# Patient Record
Sex: Male | Born: 1955 | Race: White | Hispanic: No | Marital: Single | Smoking: Former smoker
Health system: Southern US, Community
[De-identification: ages and names within clinical notes are randomized; demographics above are authoritative.]

## PROBLEM LIST (undated history)

## (undated) DIAGNOSIS — N400 Enlarged prostate without lower urinary tract symptoms: Secondary | ICD-10-CM

## (undated) DIAGNOSIS — Z973 Presence of spectacles and contact lenses: Secondary | ICD-10-CM

## (undated) DIAGNOSIS — R972 Elevated prostate specific antigen [PSA]: Secondary | ICD-10-CM

## (undated) HISTORY — PX: NO PAST SURGERIES: SHX2092

---

## 1998-08-09 ENCOUNTER — Emergency Department (HOSPITAL_COMMUNITY): Admission: EM | Admit: 1998-08-09 | Discharge: 1998-08-09 | Payer: Self-pay | Admitting: Emergency Medicine

## 2015-02-23 ENCOUNTER — Other Ambulatory Visit (HOSPITAL_COMMUNITY): Payer: Self-pay | Admitting: Internal Medicine

## 2015-02-23 DIAGNOSIS — R9431 Abnormal electrocardiogram [ECG] [EKG]: Secondary | ICD-10-CM

## 2015-03-16 ENCOUNTER — Telehealth (HOSPITAL_COMMUNITY): Payer: Self-pay

## 2015-03-16 NOTE — Telephone Encounter (Signed)
Left message on voicemail in reference to upcoming appointment scheduled for 03/16/2008. Phone number given for a call back so details instructions can be given. S.Malakai Schoenherr EMTP

## 2015-03-17 ENCOUNTER — Ambulatory Visit (HOSPITAL_COMMUNITY): Payer: BLUE CROSS/BLUE SHIELD | Attending: Cardiology

## 2015-03-17 DIAGNOSIS — R9431 Abnormal electrocardiogram [ECG] [EKG]: Secondary | ICD-10-CM

## 2015-03-17 DIAGNOSIS — R079 Chest pain, unspecified: Secondary | ICD-10-CM | POA: Insufficient documentation

## 2015-03-17 DIAGNOSIS — I447 Left bundle-branch block, unspecified: Secondary | ICD-10-CM | POA: Diagnosis not present

## 2015-03-17 LAB — MYOCARDIAL PERFUSION IMAGING
CHL CUP NUCLEAR SRS: 6
CHL CUP NUCLEAR SSS: 8
CHL CUP RESTING HR STRESS: 64 {beats}/min
CSEPPHR: 84 {beats}/min
LV sys vol: 40 mL
LVDIAVOL: 124 mL (ref 62–150)
NUC STRESS TID: 0.96
RATE: 0.31
SDS: 2

## 2015-03-17 MED ORDER — TECHNETIUM TC 99M SESTAMIBI GENERIC - CARDIOLITE
10.6000 | Freq: Once | INTRAVENOUS | Status: AC | PRN
  Administered 2015-03-17: 11 via INTRAVENOUS

## 2015-03-17 MED ORDER — REGADENOSON 0.4 MG/5ML IV SOLN
0.4000 mg | Freq: Once | INTRAVENOUS | Status: AC
  Administered 2015-03-17: 0.4 mg via INTRAVENOUS

## 2015-03-17 MED ORDER — TECHNETIUM TC 99M SESTAMIBI GENERIC - CARDIOLITE
30.9000 | Freq: Once | INTRAVENOUS | Status: AC | PRN
  Administered 2015-03-17: 30.9 via INTRAVENOUS

## 2015-04-19 ENCOUNTER — Other Ambulatory Visit (HOSPITAL_COMMUNITY): Payer: Self-pay | Admitting: Urology

## 2015-04-19 ENCOUNTER — Other Ambulatory Visit: Payer: Self-pay | Admitting: Urology

## 2015-04-19 DIAGNOSIS — R972 Elevated prostate specific antigen [PSA]: Secondary | ICD-10-CM

## 2015-05-05 ENCOUNTER — Ambulatory Visit (HOSPITAL_COMMUNITY): Payer: BLUE CROSS/BLUE SHIELD

## 2015-05-15 ENCOUNTER — Ambulatory Visit (HOSPITAL_COMMUNITY)
Admission: RE | Admit: 2015-05-15 | Discharge: 2015-05-15 | Disposition: A | Discharge status: Home / Self Care | Payer: BLUE CROSS/BLUE SHIELD | Source: Ambulatory Visit | Attending: Urology | Admitting: Urology

## 2015-05-15 DIAGNOSIS — R972 Elevated prostate specific antigen [PSA]: Secondary | ICD-10-CM | POA: Insufficient documentation

## 2015-05-15 MED ORDER — GADOBENATE DIMEGLUMINE 529 MG/ML IV SOLN
20.0000 mL | Freq: Once | INTRAVENOUS | Status: AC | PRN
  Administered 2015-05-15: 20 mL via INTRAVENOUS

## 2015-07-20 DIAGNOSIS — E291 Testicular hypofunction: Secondary | ICD-10-CM | POA: Diagnosis not present

## 2015-07-20 DIAGNOSIS — R972 Elevated prostate specific antigen [PSA]: Secondary | ICD-10-CM | POA: Diagnosis not present

## 2015-09-21 DIAGNOSIS — T1511XA Foreign body in conjunctival sac, right eye, initial encounter: Secondary | ICD-10-CM | POA: Diagnosis not present

## 2015-12-15 ENCOUNTER — Other Ambulatory Visit: Payer: Self-pay | Admitting: Urology

## 2015-12-18 ENCOUNTER — Other Ambulatory Visit (HOSPITAL_COMMUNITY): Payer: Self-pay | Admitting: Urology

## 2015-12-18 DIAGNOSIS — R972 Elevated prostate specific antigen [PSA]: Secondary | ICD-10-CM

## 2015-12-27 ENCOUNTER — Encounter (HOSPITAL_BASED_OUTPATIENT_CLINIC_OR_DEPARTMENT_OTHER): Payer: Self-pay | Admitting: *Deleted

## 2015-12-29 ENCOUNTER — Encounter (HOSPITAL_BASED_OUTPATIENT_CLINIC_OR_DEPARTMENT_OTHER): Payer: Self-pay | Admitting: *Deleted

## 2015-12-29 NOTE — Progress Notes (Signed)
NPO AFTER MN.  ARRIVE AT 0800. NEEDS HG.  PT VERBALIZED UNDERSTANDING TO STOP ASA TOMORROW.  WILL DO FLEET ENEMA AM DOS.

## 2016-01-03 ENCOUNTER — Ambulatory Visit (HOSPITAL_BASED_OUTPATIENT_CLINIC_OR_DEPARTMENT_OTHER): Payer: BLUE CROSS/BLUE SHIELD | Admitting: Anesthesiology

## 2016-01-03 ENCOUNTER — Ambulatory Visit (HOSPITAL_BASED_OUTPATIENT_CLINIC_OR_DEPARTMENT_OTHER)
Admission: RE | Admit: 2016-01-03 | Discharge: 2016-01-03 | Disposition: A | Discharge status: Home / Self Care | Payer: BLUE CROSS/BLUE SHIELD | Source: Ambulatory Visit | Attending: Urology | Admitting: Urology

## 2016-01-03 ENCOUNTER — Encounter (HOSPITAL_BASED_OUTPATIENT_CLINIC_OR_DEPARTMENT_OTHER): Payer: Self-pay | Admitting: *Deleted

## 2016-01-03 ENCOUNTER — Ambulatory Visit (HOSPITAL_COMMUNITY)
Admission: RE | Admit: 2016-01-03 | Discharge: 2016-01-03 | Disposition: A | Discharge status: Home / Self Care | Payer: BLUE CROSS/BLUE SHIELD | Source: Ambulatory Visit | Attending: Urology | Admitting: Urology

## 2016-01-03 ENCOUNTER — Encounter (HOSPITAL_BASED_OUTPATIENT_CLINIC_OR_DEPARTMENT_OTHER)
Admission: RE | Disposition: A | Discharge status: Home / Self Care | Payer: Self-pay | Source: Ambulatory Visit | Attending: Urology

## 2016-01-03 DIAGNOSIS — N400 Enlarged prostate without lower urinary tract symptoms: Secondary | ICD-10-CM | POA: Diagnosis not present

## 2016-01-03 DIAGNOSIS — Z7982 Long term (current) use of aspirin: Secondary | ICD-10-CM | POA: Diagnosis not present

## 2016-01-03 DIAGNOSIS — Z79899 Other long term (current) drug therapy: Secondary | ICD-10-CM | POA: Diagnosis not present

## 2016-01-03 DIAGNOSIS — Z87891 Personal history of nicotine dependence: Secondary | ICD-10-CM | POA: Insufficient documentation

## 2016-01-03 DIAGNOSIS — R972 Elevated prostate specific antigen [PSA]: Secondary | ICD-10-CM

## 2016-01-03 HISTORY — PX: PROSTATE BIOPSY: SHX241

## 2016-01-03 HISTORY — DX: Benign prostatic hyperplasia without lower urinary tract symptoms: N40.0

## 2016-01-03 HISTORY — DX: Presence of spectacles and contact lenses: Z97.3

## 2016-01-03 HISTORY — DX: Elevated prostate specific antigen (PSA): R97.20

## 2016-01-03 LAB — HEMOGLOBIN: Hemoglobin: 15.3 g/dL (ref 13.0–17.0)

## 2016-01-03 SURGERY — BIOPSY, PROSTATE, RECTAL APPROACH, WITH US GUIDANCE
Anesthesia: Monitor Anesthesia Care | Site: Prostate

## 2016-01-03 MED ORDER — FLEET ENEMA 7-19 GM/118ML RE ENEM
1.0000 | ENEMA | Freq: Once | RECTAL | Status: DC
  Filled 2016-01-03: qty 1

## 2016-01-03 MED ORDER — FENTANYL CITRATE (PF) 100 MCG/2ML IJ SOLN
INTRAMUSCULAR | Status: AC
  Filled 2016-01-03: qty 2

## 2016-01-03 MED ORDER — LIDOCAINE HCL 2 % EX GEL
CUTANEOUS | Status: DC | PRN
  Administered 2016-01-03: 1

## 2016-01-03 MED ORDER — CEFTRIAXONE SODIUM 2 G IJ SOLR
INTRAMUSCULAR | Status: AC
  Filled 2016-01-03: qty 2

## 2016-01-03 MED ORDER — LACTATED RINGERS IV SOLN
INTRAVENOUS | Status: DC
  Filled 2016-01-03: qty 1000

## 2016-01-03 MED ORDER — LIDOCAINE 2% (20 MG/ML) 5 ML SYRINGE
INTRAMUSCULAR | Status: AC
  Filled 2016-01-03: qty 5

## 2016-01-03 MED ORDER — LIDOCAINE HCL 2 % IJ SOLN
INTRAMUSCULAR | Status: DC | PRN
  Administered 2016-01-03: 4 mL

## 2016-01-03 MED ORDER — DEXTROSE 5 % IV SOLN
2.0000 g | INTRAVENOUS | Status: DC
  Filled 2016-01-03: qty 2

## 2016-01-03 MED ORDER — LACTATED RINGERS IV SOLN
INTRAVENOUS | Status: DC
  Administered 2016-01-03: 09:00:00 via INTRAVENOUS
  Filled 2016-01-03: qty 1000

## 2016-01-03 MED ORDER — MIDAZOLAM HCL 2 MG/2ML IJ SOLN
INTRAMUSCULAR | Status: AC
  Filled 2016-01-03: qty 2

## 2016-01-03 MED ORDER — METOCLOPRAMIDE HCL 5 MG/ML IJ SOLN
10.0000 mg | Freq: Once | INTRAMUSCULAR | Status: DC | PRN
  Filled 2016-01-03: qty 2

## 2016-01-03 MED ORDER — DEXTROSE 5 % IV SOLN
INTRAVENOUS | Status: AC
  Filled 2016-01-03: qty 50

## 2016-01-03 MED ORDER — MEPERIDINE HCL 25 MG/ML IJ SOLN
6.2500 mg | INTRAMUSCULAR | Status: DC | PRN
  Filled 2016-01-03: qty 1

## 2016-01-03 MED ORDER — FENTANYL CITRATE (PF) 100 MCG/2ML IJ SOLN
25.0000 ug | INTRAMUSCULAR | Status: DC | PRN
  Filled 2016-01-03: qty 1

## 2016-01-03 MED ORDER — PROPOFOL 500 MG/50ML IV EMUL
INTRAVENOUS | Status: AC
  Filled 2016-01-03: qty 50

## 2016-01-03 MED ORDER — FENTANYL CITRATE (PF) 100 MCG/2ML IJ SOLN
INTRAMUSCULAR | Status: DC | PRN
Start: 2016-01-03 — End: 2016-01-03
  Administered 2016-01-03: 50 ug via INTRAVENOUS

## 2016-01-03 MED ORDER — MIDAZOLAM HCL 5 MG/5ML IJ SOLN
INTRAMUSCULAR | Status: DC | PRN
  Administered 2016-01-03: 2 mg via INTRAVENOUS

## 2016-01-03 MED ORDER — PROPOFOL 500 MG/50ML IV EMUL
INTRAVENOUS | Status: DC | PRN
  Administered 2016-01-03: 75 ug/kg/min via INTRAVENOUS

## 2016-01-03 SURGICAL SUPPLY — 9 items
INST BIOPSY MAXCORE 18GX25 (NEEDLE) ×2 IMPLANT
INSTR BIOPSY MAXCORE 18GX20 (NEEDLE) IMPLANT
KIT ROOM TURNOVER WOR (KITS) ×2 IMPLANT
NDL SAFETY ECLIPSE 18X1.5 (NEEDLE) IMPLANT
NDL SPNL 22GX7 QUINCKE BK (NEEDLE) ×1 IMPLANT
NEEDLE HYPO 18GX1.5 SHARP (NEEDLE) ×2
NEEDLE SPNL 22GX7 QUINCKE BK (NEEDLE) ×2 IMPLANT
SYR CONTROL 10ML LL (SYRINGE) ×2 IMPLANT
UNDERPAD 30X30 INCONTINENT (UNDERPADS AND DIAPERS) ×2 IMPLANT

## 2016-01-03 NOTE — Transfer of Care (Signed)
Immediate Anesthesia Transfer of Care Note  Patient: William Erickson  Procedure(s) Performed: Procedure(s): BIOPSY TRANSRECTAL ULTRASONIC PROSTATE (TUBP) (N/A)  Patient Location: PACU  Anesthesia Type:MAC  Level of Consciousness: awake, alert  and oriented  Airway & Oxygen Therapy: Patient Spontanous Breathing and Patient connected to nasal cannula oxygen  Post-op Assessment: Report given to RN  Post vital signs: Reviewed and stable  Last Vitals: 134/81, 66, 12, 98% Vitals:   01/03/16 0823  BP: (!) 158/84  Pulse: 76  Resp: 18  Temp: 36.4 C    Last Pain:  Vitals:   01/03/16 0823  TempSrc: Oral      Patients Stated Pain Goal: 5 (01/03/16 0912)  Complications: No apparent anesthesia complications

## 2016-01-03 NOTE — Anesthesia Postprocedure Evaluation (Signed)
Anesthesia Post Note  Patient: William RhineDaniel E Erickson  Procedure(s) Performed: Procedure(s) (LRB): BIOPSY TRANSRECTAL ULTRASONIC PROSTATE (TUBP) (N/A)  Patient location during evaluation: PACU Anesthesia Type: MAC Level of consciousness: awake and alert Pain management: pain level controlled Vital Signs Assessment: post-procedure vital signs reviewed and stable Respiratory status: spontaneous breathing, nonlabored ventilation, respiratory function stable and patient connected to nasal cannula oxygen Cardiovascular status: stable and blood pressure returned to baseline Anesthetic complications: no       Last Vitals:  Vitals:   01/03/16 1045 01/03/16 1140  BP: 138/65 (!) 147/94  Pulse: 62 73  Resp: 17 16  Temp:  36.7 C    Last Pain:  Vitals:   01/03/16 1140  TempSrc: Tympanic  PainSc:                  Phillips Groutarignan, Yazmina Pareja

## 2016-01-03 NOTE — Anesthesia Procedure Notes (Signed)
Procedure Name: MAC Performed by: Briant SitesENENNY, Zyrus Hetland T Pre-anesthesia Checklist: Patient identified, Timeout performed, Emergency Drugs available, Suction available and Patient being monitored Patient Re-evaluated:Patient Re-evaluated prior to inductionOxygen Delivery Method: Nasal cannula Placement Confirmation: positive ETCO2

## 2016-01-03 NOTE — Op Note (Signed)
Preoperative diagnosis: Elevated PSA Postoperative diagnosis: Same  Procedure: Trusp/Bx   Surgeon: Valetta Fulleravid S. Lareen Mullings M.D.  Anesthesia: Monitored IV sedation Indications:Elevated PSA risks and benefits of this procedure were discussed with the patient on numerous occasions.     Technique and findings:The rectal probe was inserted into the rectum without difficulty. 5 cc of 2% Lidocaine without epinephrine was instilled with a spinal needle using ultrasound guidance near the junction of each seminal vesicle and the prostate.  Sequential transverse (axial) scans were made in small increments beginning at the seminal vesicles and ending at the prostatic apex. Sequential longitudinal (saggital) scans were made in small increments beginning at the right lateral prostate and ending at the left lateral prostate. Excellent anatomical imaging was obtained. The peripheral, transitional, and central zones were well-defined. The seminal vesicles were normal. In the right midportion of the prostate. There is a hyperechoic area versus dense calcifications. Prostate Volume (elliptical): Approximately 90 grams. The patient was placed in the lateral decubitus position. The prostate size was estimated to be 2+ by digital rectal exam. The 10 MHz transrectal ultrasound probe was placed into the rectum . The seminal vesicles appeared normal. No median lobe was present. The biopsy cores were obtained using direct, real-time ultrasound guidance utilizing a standard 12-core pattern with one core from the right apex lateral using real time ultrasound guidance to direct the biopsy core being taken from this location, right apex medial using real time ultrasound guidance to direct the biopsy core being taken from this location, left apex lateral using real time ultrasound guidance to direct the biopsy core being taken from this location, left apex medial using real time ultrasound guidance to direct the biopsy core being taken from  this location, right mid lateral using real time ultrasound guidance to direct the biopsy core being taken from this location, right mid medial using real time ultrasound guidance to direct the biopsy core being taken from this location, left mid lateral using real time ultrasound guidance to direct the biopsy core being taken from this location, left mid medial using real time ultrasound guidance to direct the biopsy core being taken from this location, right base lateral using real time ultrasound guidance to direct the biopsy core being taken from this location, right base medial using real time ultrasound guidance to direct the biopsy core being taken from this location, left base lateral using real time ultrasound guidance to direct the biopsy core being taken from this location and left base medial of the prostate using real time ultrasound guidance to direct the biopsy core being taken from this location. The biopsy cores were placed in buffered formalin and sent to pathlogy. Follow-up rectal examination was unremarkable. The procedure was well-tolerated and without complications.

## 2016-01-03 NOTE — Discharge Instructions (Addendum)
°  Post Anesthesia Home Care Instructions  Activity: Get plenty of rest for the remainder of the day. A responsible adult should stay with you for 24 hours following the procedure.  For the next 24 hours, DO NOT: -Drive a car -Advertising copywriterperate machinery -Drink alcoholic beverages -Take any medication unless instructed by your physician -Make any legal decisions or sign important papers.  Meals: Start with liquid foods such as gelatin or soup. Progress to regular foods as tolerated. Avoid greasy, spicy, heavy foods. If nausea and/or vomiting occur, drink only clear liquids until the nausea and/or vomiting subsides. Call your physician if vomiting continues.  Special Instructions/Symptoms: Your throat may feel dry or sore from the anesthesia or the breathing tube placed in your throat during surgery. If this causes discomfort, gargle with warm salt water. The discomfort should disappear within 24 hours.  If you had a scopolamine patch placed behind your ear for the management of post- operative nausea and/or vomiting:  1. The medication in the patch is effective for 72 hours, after which it should be removed.  Wrap patch in a tissue and discard in the trash. Wash hands thoroughly with soap and water. 2. You may remove the patch earlier than 72 hours if you experience unpleasant side effects which may include dry mouth, dizziness or visual disturbances. 3. Avoid touching the patch. Wash your hands with soap and water after contact with the patch.   For several days: he should increase his fluid intake and limit strenuous activity he might have mild discomfort at the base of his penis or in his rectum he might have blood in his urine or blood in his bowel movements For 2-3 months: he might have blood in his ejaculate (semen)  Instructions were given to call the office ASAP for blood clots in the urine or bowel movements, difficulty urinating, inability to urinate, urinary retention, painful or  frequent urination, fever, chills, nausea, vomiting, or other illness. The patient stated that he understood these instructions and would comply with them. We told the patient that prostate biopsy pathology reports are usually available within 3-5 working days, unless a pathologic second opinion is required, which may take 7-14 days. We told him to contact us to check on the status of his biopsy if he has not heard from us within 7 days.

## 2016-01-03 NOTE — H&P (Signed)
William Erickson is an [age] y.o. male.  Assessment:   For TRUSP/BX  Plan: TRUSP/Bx     HPI: William Erickson is a 60 year old male who presents to for prostate ultrasound and biopsy under sedation. He was noted a proximal ureter go to have an elevated PSA of 5.7. Rectal exam was unremarkable. We strongly recommended ultrasound and biopsy. We repeated his PSA and found to be 5.4. PSA 2 is also very low 12%. The patient insisted on prostate MRI prior to a biopsy. That was performed in May 2017. Prostate MRI did not show any areas that were suspicious for high-grade macroscopic cancer. There were a few areas that suggested possible low-grade prostate cancer. No other concerning findings were noted. There was a strong recommendation that he to need to consider ultrasound and biopsy of the prostate. He has delayed or postponed this recommendation now for proximally 6 months. He then did agree to proceed with the procedure but only if it can be done under IV sedation. He presents for that now.  Past Medical History:  Diagnosis Date  . BPH (benign prostatic hyperplasia)   . Elevated PSA   . Wears glasses     Past Surgical History:  Procedure Laterality Date  . NO PAST SURGERIES      Medications Prior to Admission  Medication Sig Dispense Refill  . aspirin EC 325 MG tablet Take 650 mg by mouth daily.    . Cholecalciferol (VITAMIN D-3) 1000 units CAPS Take 1 capsule by mouth daily.    . Misc Natural Products (PRO HERBS PROSTATE PO) Take by mouth daily.    . Multiple Vitamin (MULTIVITAMIN) tablet Take 1 tablet by mouth daily.    . Omega-3 Fatty Acids (FISH OIL) 1000 MG CAPS Take 1 capsule by mouth daily.    . tamsulosin (FLOMAX) 0.4 MG CAPS capsule Take 0.4 mg by mouth every evening.    . TURMERIC PO Take by mouth as needed.      Allergies:  Allergies  Allergen Reactions  . Ivp Dye [Iodinated Diagnostic Agents] Swelling  . Shrimp [Shellfish Allergy] Swelling    History reviewed. No pertinent family  history.  Social History:  reports that he quit smoking about 10 years ago. His smoking use included Cigarettes. He quit after 20.00 years of use. He has never used smokeless tobacco. He reports that he drinks alcohol. He reports that he does not use drugs.  Review of Systems: Pertinent items are noted in HPI. A comprehensive review of systems was negative except for: No concerning findings. Patient continues to have some frequency urgency and hesitancy.  Results for orders placed or performed during the hospital encounter of 01/03/16 (from the past 48 hour(s))  Hemoglobin     Status: None   Collection Time: 01/03/16  9:00 AM  Result Value Ref Range   Hemoglobin 15.3 13.0 - 17.0 g/dL    No results found.  Temp:  [97.6 F (36.4 C)] 97.6 F (36.4 C) (12/27 0823) Pulse Rate:  [76] 76 (12/27 0823) Resp:  [18] 18 (12/27 0823) BP: (158)/(84) 158/84 (12/27 0823) SpO2:  [97 %] 97 % (12/27 0823) Weight:  [101.2 kg (223 lb)] 101.2 kg (223 lb) (12/27 16100823)  Physical Exam Constitutional: Well nourished and well developed . No acute distress.  ENT:. The ears and nose are normal in appearance.  Neck: The appearance of the neck is normal and no neck mass is present.  Pulmonary: No respiratory distress and normal respiratory rhythm and effort.  Cardiovascular: Heart rate and rhythm are normal . No peripheral edema.  Abdomen: The abdomen is soft and nontender. No masses are palpated. No CVA tenderness. No hernias are palpable. No hepatosplenomegaly noted.  Rectal: Rectal exam demonstrates normal sphincter tone, no tenderness and no masses. The prostate has no nodularity and is not tender. The left seminal vesicle is nonpalpable. The right seminal vesicle is nonpalpable. The perineum is normal on inspection.  Genitourinary: Examination of the penis demonstrates no discharge, no masses, no lesions and a normal meatus. The scrotum is without lesions. The right epididymis is palpably normal and  non-tender. The left epididymis is palpably normal and non-tender. The right testis is non-tender and without masses. The left testis is non-tender and without masses.  Lymphatics: The femoral and inguinal nodes are not enlarged or tender.  Skin: Normal skin turgor, no visible rash and no visible skin lesions.  Neuro/Psych:. Mood and affect are appropriate.       Teliah Buffalo S 01/03/2016, 9:22 AM

## 2016-01-03 NOTE — Anesthesia Preprocedure Evaluation (Signed)
Anesthesia Evaluation  Patient identified by MRN, date of birth, ID band Patient awake    Reviewed: Allergy & Precautions, NPO status , Patient's Chart, lab work & pertinent test results  Airway Mallampati: II  TM Distance: >3 FB Neck ROM: Full    Dental no notable dental hx.    Pulmonary neg pulmonary ROS, former smoker,    Pulmonary exam normal breath sounds clear to auscultation       Cardiovascular negative cardio ROS Normal cardiovascular exam Rhythm:Regular Rate:Normal     Neuro/Psych negative neurological ROS  negative psych ROS   GI/Hepatic negative GI ROS, Neg liver ROS,   Endo/Other  negative endocrine ROS  Renal/GU negative Renal ROS  negative genitourinary   Musculoskeletal negative musculoskeletal ROS (+)   Abdominal   Peds negative pediatric ROS (+)  Hematology negative hematology ROS (+)   Anesthesia Other Findings   Reproductive/Obstetrics negative OB ROS                             Anesthesia Physical Anesthesia Plan  ASA: I  Anesthesia Plan: MAC   Post-op Pain Management:    Induction: Intravenous  Airway Management Planned: Simple Face Mask  Additional Equipment:   Intra-op Plan:   Post-operative Plan:   Informed Consent: I have reviewed the patients History and Physical, chart, labs and discussed the procedure including the risks, benefits and alternatives for the proposed anesthesia with the patient or authorized representative who has indicated his/her understanding and acceptance.   Dental advisory given  Plan Discussed with: CRNA  Anesthesia Plan Comments:        Anesthesia Quick Evaluation  

## 2016-01-04 ENCOUNTER — Encounter (HOSPITAL_BASED_OUTPATIENT_CLINIC_OR_DEPARTMENT_OTHER): Payer: Self-pay | Admitting: Urology

## 2016-01-10 DIAGNOSIS — R3912 Poor urinary stream: Secondary | ICD-10-CM | POA: Diagnosis not present

## 2016-01-10 DIAGNOSIS — R972 Elevated prostate specific antigen [PSA]: Secondary | ICD-10-CM | POA: Diagnosis not present

## 2016-01-10 DIAGNOSIS — N401 Enlarged prostate with lower urinary tract symptoms: Secondary | ICD-10-CM | POA: Diagnosis not present

## 2016-01-10 DIAGNOSIS — E291 Testicular hypofunction: Secondary | ICD-10-CM | POA: Diagnosis not present

## 2016-01-19 DIAGNOSIS — E291 Testicular hypofunction: Secondary | ICD-10-CM | POA: Diagnosis not present

## 2016-04-11 DIAGNOSIS — E291 Testicular hypofunction: Secondary | ICD-10-CM | POA: Diagnosis not present

## 2016-04-17 DIAGNOSIS — E291 Testicular hypofunction: Secondary | ICD-10-CM | POA: Diagnosis not present

## 2016-07-24 DIAGNOSIS — E291 Testicular hypofunction: Secondary | ICD-10-CM | POA: Diagnosis not present

## 2016-07-24 DIAGNOSIS — R972 Elevated prostate specific antigen [PSA]: Secondary | ICD-10-CM | POA: Diagnosis not present

## 2016-07-31 DIAGNOSIS — R972 Elevated prostate specific antigen [PSA]: Secondary | ICD-10-CM | POA: Diagnosis not present

## 2016-07-31 DIAGNOSIS — N5201 Erectile dysfunction due to arterial insufficiency: Secondary | ICD-10-CM | POA: Diagnosis not present

## 2016-07-31 DIAGNOSIS — N401 Enlarged prostate with lower urinary tract symptoms: Secondary | ICD-10-CM | POA: Diagnosis not present

## 2016-07-31 DIAGNOSIS — R3912 Poor urinary stream: Secondary | ICD-10-CM | POA: Diagnosis not present

## 2016-09-19 DIAGNOSIS — H2513 Age-related nuclear cataract, bilateral: Secondary | ICD-10-CM | POA: Diagnosis not present

## 2016-12-17 DIAGNOSIS — J019 Acute sinusitis, unspecified: Secondary | ICD-10-CM | POA: Diagnosis not present

## 2016-12-25 DIAGNOSIS — N401 Enlarged prostate with lower urinary tract symptoms: Secondary | ICD-10-CM | POA: Diagnosis not present

## 2016-12-25 DIAGNOSIS — N5201 Erectile dysfunction due to arterial insufficiency: Secondary | ICD-10-CM | POA: Diagnosis not present

## 2016-12-25 DIAGNOSIS — R3912 Poor urinary stream: Secondary | ICD-10-CM | POA: Diagnosis not present

## 2016-12-25 DIAGNOSIS — E291 Testicular hypofunction: Secondary | ICD-10-CM | POA: Diagnosis not present

## 2016-12-27 ENCOUNTER — Ambulatory Visit: Payer: BLUE CROSS/BLUE SHIELD | Admitting: Family Medicine

## 2016-12-27 ENCOUNTER — Encounter: Payer: Self-pay | Admitting: Family Medicine

## 2016-12-27 ENCOUNTER — Telehealth: Payer: Self-pay | Admitting: Family Medicine

## 2016-12-27 VITALS — BP 130/80 | HR 82 | Temp 97.9°F | Ht 70.5 in | Wt 228.0 lb

## 2016-12-27 DIAGNOSIS — L290 Pruritus ani: Secondary | ICD-10-CM

## 2016-12-27 DIAGNOSIS — Z1211 Encounter for screening for malignant neoplasm of colon: Secondary | ICD-10-CM | POA: Diagnosis not present

## 2016-12-27 DIAGNOSIS — R0981 Nasal congestion: Secondary | ICD-10-CM | POA: Insufficient documentation

## 2016-12-27 DIAGNOSIS — T485X1A Poisoning by other anti-common-cold drugs, accidental (unintentional), initial encounter: Secondary | ICD-10-CM

## 2016-12-27 DIAGNOSIS — K649 Unspecified hemorrhoids: Secondary | ICD-10-CM

## 2016-12-27 DIAGNOSIS — J310 Chronic rhinitis: Secondary | ICD-10-CM | POA: Diagnosis not present

## 2016-12-27 DIAGNOSIS — Z0000 Encounter for general adult medical examination without abnormal findings: Secondary | ICD-10-CM | POA: Diagnosis not present

## 2016-12-27 DIAGNOSIS — T485X5A Adverse effect of other anti-common-cold drugs, initial encounter: Secondary | ICD-10-CM

## 2016-12-27 DIAGNOSIS — Z23 Encounter for immunization: Secondary | ICD-10-CM | POA: Diagnosis not present

## 2016-12-27 DIAGNOSIS — F1010 Alcohol abuse, uncomplicated: Secondary | ICD-10-CM | POA: Diagnosis not present

## 2016-12-27 MED ORDER — HYDROCORTISONE ACE-PRAMOXINE 1-1 % RE CREA: 1.0000 "application " | TOPICAL_CREAM | Freq: Two times a day (BID) | RECTAL | 0 refills | Status: AC

## 2016-12-27 NOTE — Telephone Encounter (Signed)
Yes, I will send a cream to pharm. I am concerned about long term use of creams in his affected area damaging the skin. I also want him to see a dermatologist for this.

## 2016-12-27 NOTE — Telephone Encounter (Signed)
Copied from CRM [phone]. Topic: Quick Communication - See Telephone Encounter >> Dec 27, 2016 12:06 PM Clack, Princella PellegriniJessica D wrote: CRM for notification. See Telephone encounter for:  Pt states he wanted to make sure Dr. Doreene BurkeKremer would be calling his Cortizone cream into [phone] [place] [address] [facility] [address] [facility] [address] [phone] (Phone) [phone] (Fax)   12/27/16.

## 2016-12-27 NOTE — Progress Notes (Addendum)
Subjective:  Patient ID: William Erickson, male    DOB: 05/27/1955  Age: 61 y.o. MRN: [id number]  CC: Establish Care   HPI William Erickson presents for establishment of care.  He is accompanied by his wife.  They have 8 cats.  He deals with chronic nasal congestion and uses nasal decongestants a regular basis.  He is currently being treated for sinusitis with amoxicillin.  He has significant issue with BPH and luts.  He is under urology care for elevated PSA, ED, and loss of libido.  Status post negative prostate biopsies.  He has tried finasteride, exogenous testosterone without relief.  He was recently taken off of the finasteride.  He takes 2 Flomax daily in order to urinate.  He has an ongoing issue with anal itching.  He says that he will need a stronger steroid creams will help him.  He has a history of anal hemorrhoids.  He is meticulous with his anal hygiene after stooling.  He uses the toilet paper followed by cotton soaked and which hazel.  He has never had a colonoscopy.  His wife is a colon cancer survivor.  He drinks 4 glasses of wine daily.  He is not concerned about his alcohol use but his wife is.  He says that she drinks along with them.  He has not been on a weight or felt guilty about his drinking but has been to AA in the past.  He does not take eye openers.  Both of his parents were alcoholics.  He has siblings with alcoholism as well.  He is not interested in quitting at this time.  He is interested in losing weight and believes that he will cut down on his drinking in order to do that.  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  Clients often call him throughout the day and night.  He has a distant history of depression that required medical therapy.  He has no current signs and symptoms of depression.   William Erickson has a past medical history of BPH (benign prostatic hyperplasia), Elevated PSA, and Wears glasses.   He has a past surgical history that includes No past  surgeries and Prostate biopsy (N/A, 01/03/2016).   His family history is not on file.He reports that he quit smoking about 11 years ago. His smoking use included cigarettes. He quit after 20.00 years of use. he has never used smokeless tobacco. He reports that he drinks alcohol. He reports that he does not use drugs.  Outpatient Medications Prior to Visit  Medication Sig Dispense Refill  . Cholecalciferol (VITAMIN D-3) 1000 units CAPS Take 1 capsule by mouth daily.    . Misc Natural Products (PRO HERBS PROSTATE PO) Take by mouth daily.    . Multiple Vitamin (MULTIVITAMIN) tablet Take 1 tablet by mouth daily.    . Omega-3 Fatty Acids (FISH OIL) 1000 MG CAPS Take 1 capsule by mouth daily.    . tamsulosin (FLOMAX) 0.4 MG CAPS capsule Take 0.4 mg by mouth every evening.    . TURMERIC PO Take by mouth as needed.     No facility-administered medications prior to visit.     ROS Review of Systems  Constitutional: Negative for chills, fever and unexpected weight change.  HENT: Positive for congestion.   Eyes: Negative.   Respiratory: Negative for cough and wheezing.   Cardiovascular: Negative for chest pain, palpitations and leg swelling.  Gastrointestinal: Negative for anal bleeding and blood in stool.  Endocrine: Negative for polyphagia and polyuria.  Genitourinary: Positive for difficulty urinating and frequency. Negative for decreased urine volume.  Musculoskeletal: Negative for gait problem.  Skin: Negative for pallor and rash.  Allergic/Immunologic: Negative for immunocompromised state.  Neurological: Negative for speech difficulty, weakness and light-headedness.  Hematological: Does not bruise/bleed easily.  Psychiatric/Behavioral: Negative for dysphoric mood.    Objective:  BP 130/80 (BP Location: Left Arm, Patient Position: Sitting, Cuff Size: Normal)   Pulse 82   Temp 97.9 F (36.6 C) (Oral)   Ht 5' 10.5" (1.791 m)   Wt 228 lb (103.4 kg)   SpO2 96%   BMI 32.25 kg/m    Physical Exam  Constitutional: He is oriented to person, place, and time. He appears well-developed and well-nourished. No distress.  HENT:  Head: Normocephalic and atraumatic.  Right Ear: External ear normal.  Left Ear: External ear normal.  Mouth/Throat: Oropharynx is clear and moist. No oropharyngeal exudate.  Eyes: Conjunctivae are normal. Pupils are equal, round, and reactive to light. Right eye exhibits no discharge. Left eye exhibits no discharge. No scleral icterus.  Neck: Neck supple. No JVD present. No tracheal deviation present. No thyromegaly present.  Cardiovascular: Normal rate, regular rhythm and normal heart sounds.  Pulmonary/Chest: Effort normal and breath sounds normal. No stridor.  Abdominal: Soft. Bowel sounds are normal. He exhibits distension. There is no hepatosplenomegaly. There is no tenderness. There is no rebound, no guarding and no CVA tenderness. A hernia is present. Hernia confirmed positive in the ventral area.  Genitourinary: Rectal exam shows external hemorrhoid.  Lymphadenopathy:    He has no cervical adenopathy.  Neurological: He is alert and oriented to person, place, and time.  Skin: Skin is warm and dry. He is not diaphoretic.  Psychiatric: He has a normal mood and affect. His behavior is normal.      Assessment & Plan:   William Erickson was seen today for establish care.  Diagnoses and all orders for this visit:  Screen for colon cancer -     Ambulatory referral to General Surgery  Hemorrhoids, unspecified hemorrhoid type -     Ambulatory referral to General Surgery  Anal itching -     Ambulatory referral to Dermatology -     pramoxine-hydrocortisone (PROCTOCREAM-HC) 1-1 % rectal cream; Place 1 application rectally 2 (two) times daily.  Health care maintenance -     CBC; Future -     Comprehensive metabolic panel; Future -     Lipid panel; Future -     TSH; Future -     Urinalysis, Routine w reflex microscopic; Future -     VITAMIN D 25  Hydroxy (Vit-D Deficiency, Fractures); Future -     Hepatitis C antibody; Future -     HIV antibody; Future  Alcohol abuse -     Gamma GT  Nasal congestion due to prolonged use of decongestants  Need for influenza vaccination -     Flu Vaccine QUAD 36+ mos IM   I have discontinued William Rhineaniel E. Landress "William Erickson"'s tamsulosin, Fish Oil, multivitamin, Vitamin D-3, TURMERIC PO, and Misc Natural Products (PRO HERBS PROSTATE PO). I am also having him start on pramoxine-hydrocortisone.  Meds ordered this encounter  Medications  . pramoxine-hydrocortisone (PROCTOCREAM-HC) 1-1 % rectal cream    Sig: Place 1 application rectally 2 (two) times daily.    Dispense:  30 g    Refill:  0   I am concerned that he may be overusing topical steroids in the anal  area.  I would like for him to see dermatology.  He is not interested in quitting alcohol at this time but he knows of my concern about his drinking.  Advised nasal steroids with a gradual withdrawal of nasal decongestants.  He will return fasting for blood work.  Follow-up: Return in about 3 months (around 03/27/2017).  Mliss Sax, MD

## 2016-12-27 NOTE — Telephone Encounter (Signed)
Dr. Doreene BurkeKremer - please advise on if you were going to send coritsone to the pharmacy for patient.  Thanks!

## 2016-12-27 NOTE — Telephone Encounter (Signed)
SS-I don't see any orders for a cortisone cream but the pharmacy is correct in the chart/may want to ask Dr. Doreene BurkeKremer if he intended to send that in/thx dmf

## 2017-01-01 ENCOUNTER — Encounter: Payer: Self-pay | Admitting: Family Medicine

## 2017-01-13 ENCOUNTER — Other Ambulatory Visit (INDEPENDENT_AMBULATORY_CARE_PROVIDER_SITE_OTHER): Payer: BLUE CROSS/BLUE SHIELD

## 2017-01-13 DIAGNOSIS — Z0000 Encounter for general adult medical examination without abnormal findings: Secondary | ICD-10-CM

## 2017-01-13 LAB — COMPREHENSIVE METABOLIC PANEL
ALBUMIN: 4.4 g/dL (ref 3.5–5.2)
ALT: 44 U/L (ref 0–53)
AST: 24 U/L (ref 0–37)
Alkaline Phosphatase: 58 U/L (ref 39–117)
BUN: 11 mg/dL (ref 6–23)
CALCIUM: 9.7 mg/dL (ref 8.4–10.5)
CO2: 27 meq/L (ref 19–32)
CREATININE: 0.84 mg/dL (ref 0.40–1.50)
Chloride: 102 mEq/L (ref 96–112)
GFR: 98.57 mL/min (ref >60.00)
Glucose, Bld: 101 mg/dL — ABNORMAL HIGH (ref 70–99)
Potassium: 4.3 mEq/L (ref 3.5–5.1)
Sodium: 139 mEq/L (ref 135–145)
Total Bilirubin: 0.6 mg/dL (ref 0.2–1.2)
Total Protein: 6.9 g/dL (ref 6.0–8.3)

## 2017-01-13 LAB — URINALYSIS, ROUTINE W REFLEX MICROSCOPIC
BILIRUBIN URINE: NEGATIVE
HGB URINE DIPSTICK: NEGATIVE
Ketones, ur: NEGATIVE
Leukocytes, UA: NEGATIVE
NITRITE: NEGATIVE
Specific Gravity, Urine: >=1.03 — AB (ref 1.000–1.030)
TOTAL PROTEIN, URINE-UPE24: NEGATIVE
URINE GLUCOSE: NEGATIVE
Urobilinogen, UA: 0.2 (ref 0.0–1.0)
pH: 5 (ref 5.0–8.0)

## 2017-01-13 LAB — CBC
HCT: 46.3 % (ref 39.0–52.0)
Hemoglobin: 15.5 g/dL (ref 13.0–17.0)
MCHC: 33.4 g/dL (ref 30.0–36.0)
MCV: 91.9 fl (ref 78.0–100.0)
Platelets: 180 10*3/uL (ref 150.0–400.0)
RBC: 5.04 Mil/uL (ref 4.22–5.81)
RDW: 13.2 % (ref 11.5–15.5)
WBC: 4.8 10*3/uL (ref 4.0–10.5)

## 2017-01-13 LAB — LIPID PANEL
Cholesterol: 309 mg/dL — ABNORMAL HIGH (ref 0–200)
HDL: 42.3 mg/dL (ref >39.00)
Total CHOL/HDL Ratio: 7
Triglycerides: 789 mg/dL — ABNORMAL HIGH (ref 0.0–149.0)

## 2017-01-13 LAB — TSH: TSH: 1.75 u[IU]/mL (ref 0.35–4.50)

## 2017-01-13 LAB — VITAMIN D 25 HYDROXY (VIT D DEFICIENCY, FRACTURES): VITD: 23.54 ng/mL — AB (ref 30.00–100.00)

## 2017-01-13 LAB — LDL CHOLESTEROL, DIRECT: LDL DIRECT: 150 mg/dL

## 2017-01-15 ENCOUNTER — Other Ambulatory Visit: Payer: Self-pay

## 2017-01-15 LAB — HEPATITIS C ANTIBODY
Hepatitis C Ab: NONREACTIVE
SIGNAL TO CUT-OFF: 0.01 (ref <1.00)

## 2017-01-15 LAB — HIV ANTIBODY (ROUTINE TESTING W REFLEX): HIV 1&2 Ab, 4th Generation: NONREACTIVE

## 2017-01-15 MED ORDER — VITAMIN D (ERGOCALCIFEROL) 1.25 MG (50000 UNIT) PO CAPS: 50000.0000 [IU] | ORAL_CAPSULE | ORAL | 1 refills | Status: DC

## 2017-02-17 ENCOUNTER — Ambulatory Visit: Payer: Self-pay | Admitting: Surgery

## 2017-02-17 DIAGNOSIS — K649 Unspecified hemorrhoids: Secondary | ICD-10-CM | POA: Diagnosis not present

## 2017-02-17 NOTE — H&P (Signed)
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  He reports a history of "hemorrhoids" for many years.  He does have some bleeding with bowel movements but states this is usually uncommon.  He has anal itching which occurs daily and is worse at night that he has been applying hydrocortisone cream for and notes improvement with this.  He does not take any daily fiber supplement.  He drinks 40 ounces of water per day.  He spends at least 5 minutes on the commode.  He has never had a colonoscopy. PMH: As mentioned above PSH: No prior anorectal procedures FHx: Uncle on his mother's side of the family with throat cancer.  His father had CVA. He denies any family history of other malignancy. Social: Reformed smoker - 20-pack-year history quit 15 years ago.  He drinks 4 beers or glasses of wine per day.  Rare marijuana use. ROS: A comprehensive 10 system review of systems was completed with the patient and pertinent findings as noted above.  The patient is a 62 year old male.   Past Surgical History Doristine Devoid(Chemira Jones, CMA; 02/17/2017 3:19 PM) No pertinent past surgical history    Diagnostic Studies History Doristine Devoid(Chemira Jones, CMA; 02/17/2017 3:19 PM) Colonoscopy   never  Allergies Doristine Devoid(Chemira Jones, CMA; 02/17/2017 3:14 PM) Iodinated Diagnostic Agents   SHRIMP    Medication History (Doristine DevoidChemira Jones, CMA; 02/17/2017 3:13 PM) Tamsulosin HCl  (0.4MG  Capsule, Oral) Active. Medications Reconciled   Social History Doristine Devoid(Chemira Jones, CMA; 02/17/2017 3:19 PM) Alcohol use   Moderate alcohol use. Caffeine use   Coffee. Illicit drug use   Remotely quit drug use. Tobacco use   Former smoker.  Family History Doristine Devoid(Chemira Jones, CMA; 02/17/2017 3:19 PM) Alcohol Abuse   Brother, Father, Mother, Sister.  Other Problems Doristine Devoid(Chemira Jones, CMA; 02/17/2017 3:19 PM) Alcohol Abuse   Anxiety Disorder   Depression    Enlarged Prostate   Gastroesophageal Reflux Disease   Heart murmur   Hemorrhoids      Review of Systems (Chemira Jones CMA; 02/17/2017 3:19 PM) General Not Present- Appetite Loss, Chills, Fatigue, Fever, Night Sweats, Weight Gain and Weight Loss. Skin Not Present- Change in Wart/Mole, Dryness, Hives, Jaundice, New Lesions, Non-Healing Wounds, Rash and Ulcer. HEENT Present- Wears glasses/contact lenses. Not Present- Earache, Hearing Loss, Hoarseness, Nose Bleed, Oral Ulcers, Ringing in the Ears, Seasonal Allergies, Sinus Pain, Sore Throat, Visual Disturbances and Yellow Eyes. Respiratory Not Present- Bloody sputum, Chronic Cough, Difficulty Breathing, Snoring and Wheezing. Breast Not Present- Breast Mass, Breast Pain, Nipple Discharge and Skin Changes. Cardiovascular Not Present- Chest Pain, Difficulty Breathing Lying Down, Leg Cramps, Palpitations, Rapid Heart Rate, Shortness of Breath and Swelling of Extremities. Gastrointestinal Present- Hemorrhoids. Not Present- Abdominal Pain, Bloating, Bloody Stool, Change in Bowel Habits, Chronic diarrhea, Constipation, Difficulty Swallowing, Excessive gas, Gets full quickly at meals, Indigestion, Nausea, Rectal Pain and Vomiting. Male Genitourinary Present- Frequency, Impotence and Urgency. Not Present- Blood in Urine, Change in Urinary Stream, Nocturia, Painful Urination and Urine Leakage. Musculoskeletal Not Present- Back Pain, Joint Pain, Joint Stiffness, Muscle Pain, Muscle Weakness and Swelling of Extremities. Neurological Not Present- Decreased Memory, Fainting, Headaches, Numbness, Seizures, Tingling, Tremor, Trouble walking and Weakness. Psychiatric Not Present- Anxiety, Bipolar, Change in Sleep Pattern, Depression, Fearful and Frequent crying. Endocrine Not Present- Cold Intolerance, Excessive Hunger, Hair Changes, Heat Intolerance, Hot flashes and New Diabetes. Hematology Not Present- Blood Thinners, Easy Bruising, Excessive  bleeding, Gland  problems, HIV and Persistent Infections.  Vitals (Chemira Jones CMA; 02/17/2017 3:13 PM) 02/17/2017 3:13 PM Weight: 224.8 lb   Height: 70 in  Body Surface Area: 2.19 m   Body Mass Index: 32.26 kg/m   Temp.: 97.7 F (Oral)    Pulse: 95 (Regular)    BP: 138/80 (Sitting, Left Arm, Standard)       Physical Exam Cristal Deer M. Ravina Milner MD; 02/17/2017 3:58 PM) The physical exam findings are as follows: Note: Constitutional: No acute distress; conversant; no deformities Eyes: Moist conjunctiva; no lid lag; anicteric sclerae Neck: Trachea midline; no palpable thyromegaly Lungs: Normal respiratory effort; no tactile fremitus CV: RRR; no palpable thrill; no pitting edema GI: Abdomen soft, nontender, nondistended; no palpable hepatosplenomegaly Anorectal: External skin tags; skin changes consistent with pruritus ani; no identifiable fissures. DRE-no palpable masses, good tone Anoscopy: 3 column hemorrhoids. No other lesions identified MSK: Normal gait; no clubbing/cyanosis Psychiatric: Appropriate affect; alert and oriented 3 Lymphatic: No palpable cervical or axillary lymphadenopathy    Assessment & Plan Cristal Deer M. Pragya Lofaso MD; 02/17/2017 4:03 PM) HEMORRHOIDS (K64.9) Impression: William Erickson is a very pleasant [phone] with rectal bleeding, hemorrhoids and pruritus ani -Schedule colonoscopy as he has never had one prior - he would prefer to have this done with [place] as opposed to a referral to see another physician. -I recommended he begin taking a daily fiber supplement (Metamucil/FiberCon/Citrucel/Benefiber/Konsyl), drinks 64 ounces of water per day (8, 8 ounce glasses), and minimize time on commode to 2-3 minutes. Written instructions were additionally provided. -For his pruritus ani, I have recommended he begin Domeboro solution for anal itching and provided written instructions for this - mix 1 packet in a pint of water and cleanse perianal area 3 times daily and after bowel movements and as  necessary. No additional cranes are necessary. Minimize anal itching. -The hemorrhoid book was provided for additional reading. -The anatomy and physiology of the GI tract was discussed at length with the patient. The pathophysiology of hemorrhoids and pruritus was discussed at length with associated pictures. -We discussed colonoscopy and its technique. The planned procedure, material risks (including, but not limited to, pain, bleeding, need for blood transfusion, infection, need for additional procedures, perforation of GI tract, aspiration, heart attack, stroke, death) benefits and alternatives were discussed at length. The patient's questions were answered to his satisfaction and he elected to proceed with colonoscopy. Current Plans ANOSCOPY, DIAGNOSTIC ([id number]) Signed electronically by Andria Meuse, MD (02/17/2017 4:04 PM)

## 2017-03-24 ENCOUNTER — Ambulatory Visit: Payer: BLUE CROSS/BLUE SHIELD | Admitting: Family Medicine

## 2017-07-15 ENCOUNTER — Other Ambulatory Visit: Payer: Self-pay | Admitting: Family Medicine

## 2017-07-22 DIAGNOSIS — R972 Elevated prostate specific antigen [PSA]: Secondary | ICD-10-CM | POA: Diagnosis not present

## 2017-09-11 DIAGNOSIS — N5201 Erectile dysfunction due to arterial insufficiency: Secondary | ICD-10-CM | POA: Diagnosis not present

## 2017-09-11 DIAGNOSIS — R972 Elevated prostate specific antigen [PSA]: Secondary | ICD-10-CM | POA: Diagnosis not present

## 2017-09-11 DIAGNOSIS — R3912 Poor urinary stream: Secondary | ICD-10-CM | POA: Diagnosis not present

## 2017-09-11 DIAGNOSIS — N401 Enlarged prostate with lower urinary tract symptoms: Secondary | ICD-10-CM | POA: Diagnosis not present

## 2017-10-04 ENCOUNTER — Other Ambulatory Visit: Payer: Self-pay | Admitting: Family Medicine

## 2017-10-06 NOTE — Telephone Encounter (Signed)
Okay to refill. Please schedule patient to see me fasting.

## 2018-01-05 DIAGNOSIS — R972 Elevated prostate specific antigen [PSA]: Secondary | ICD-10-CM | POA: Diagnosis not present

## 2018-04-24 ENCOUNTER — Telehealth: Payer: Self-pay | Admitting: Family Medicine

## 2018-04-24 NOTE — Telephone Encounter (Signed)
Called pt on behalf of Dr Doreene Burke because it had been a while since patient had been in, left message

## 2018-05-06 ENCOUNTER — Telehealth: Payer: Self-pay | Admitting: Family Medicine

## 2018-05-06 NOTE — Telephone Encounter (Signed)
Called and could not leave vm. Calling to schedule virtual visit with Dr. Doreene Burke.

## 2018-05-09 DIAGNOSIS — J069 Acute upper respiratory infection, unspecified: Secondary | ICD-10-CM | POA: Diagnosis not present

## 2018-05-09 DIAGNOSIS — B349 Viral infection, unspecified: Secondary | ICD-10-CM | POA: Diagnosis not present

## 2018-05-09 DIAGNOSIS — Z20828 Contact with and (suspected) exposure to other viral communicable diseases: Secondary | ICD-10-CM | POA: Diagnosis not present

## 2018-05-14 DIAGNOSIS — Z03818 Encounter for observation for suspected exposure to other biological agents ruled out: Secondary | ICD-10-CM | POA: Diagnosis not present

## 2018-05-19 DIAGNOSIS — R972 Elevated prostate specific antigen [PSA]: Secondary | ICD-10-CM | POA: Diagnosis not present

## 2018-11-24 DIAGNOSIS — R1900 Intra-abdominal and pelvic swelling, mass and lump, unspecified site: Secondary | ICD-10-CM | POA: Diagnosis not present

## 2018-11-24 DIAGNOSIS — R5383 Other fatigue: Secondary | ICD-10-CM | POA: Diagnosis not present

## 2018-11-24 DIAGNOSIS — Z03818 Encounter for observation for suspected exposure to other biological agents ruled out: Secondary | ICD-10-CM | POA: Diagnosis not present

## 2018-11-24 DIAGNOSIS — R110 Nausea: Secondary | ICD-10-CM | POA: Diagnosis not present

## 2018-11-26 DIAGNOSIS — R1900 Intra-abdominal and pelvic swelling, mass and lump, unspecified site: Secondary | ICD-10-CM | POA: Diagnosis not present

## 2018-12-15 ENCOUNTER — Other Ambulatory Visit: Payer: Self-pay

## 2018-12-15 DIAGNOSIS — Z20822 Contact with and (suspected) exposure to covid-19: Secondary | ICD-10-CM

## 2018-12-18 ENCOUNTER — Telehealth: Payer: Self-pay

## 2018-12-18 LAB — NOVEL CORONAVIRUS, NAA: SARS-CoV-2, NAA: DETECTED — AB

## 2018-12-18 NOTE — Telephone Encounter (Signed)
Caller advise that result not back yet  

## 2019-06-13 DIAGNOSIS — R55 Syncope and collapse: Secondary | ICD-10-CM | POA: Diagnosis not present

## 2019-12-09 DIAGNOSIS — Z7982 Long term (current) use of aspirin: Secondary | ICD-10-CM | POA: Insufficient documentation

## 2019-12-09 DIAGNOSIS — Z87891 Personal history of nicotine dependence: Secondary | ICD-10-CM | POA: Diagnosis not present

## 2019-12-09 DIAGNOSIS — R55 Syncope and collapse: Secondary | ICD-10-CM | POA: Diagnosis present

## 2019-12-09 DIAGNOSIS — I951 Orthostatic hypotension: Secondary | ICD-10-CM | POA: Diagnosis not present

## 2019-12-10 ENCOUNTER — Emergency Department (HOSPITAL_COMMUNITY)
Admission: EM | Admit: 2019-12-10 | Discharge: 2019-12-10 | Disposition: A | Discharge status: Home / Self Care | Payer: 59 | Attending: Emergency Medicine | Admitting: Emergency Medicine

## 2019-12-10 ENCOUNTER — Emergency Department (HOSPITAL_COMMUNITY): Payer: 59

## 2019-12-10 ENCOUNTER — Other Ambulatory Visit: Payer: Self-pay

## 2019-12-10 ENCOUNTER — Encounter (HOSPITAL_COMMUNITY): Payer: Self-pay

## 2019-12-10 DIAGNOSIS — I951 Orthostatic hypotension: Secondary | ICD-10-CM

## 2019-12-10 LAB — CBC
HCT: 40.1 % (ref 39.0–52.0)
Hemoglobin: 13.8 g/dL (ref 13.0–17.0)
MCH: 31.2 pg (ref 26.0–34.0)
MCHC: 34.4 g/dL (ref 30.0–36.0)
MCV: 90.7 fL (ref 80.0–100.0)
Platelets: 198 10*3/uL (ref 150–400)
RBC: 4.42 MIL/uL (ref 4.22–5.81)
RDW: 12.1 % (ref 11.5–15.5)
WBC: 7.8 10*3/uL (ref 4.0–10.5)
nRBC: 0 % (ref 0.0–0.2)

## 2019-12-10 LAB — BASIC METABOLIC PANEL
Anion gap: 13 (ref 5–15)
BUN: 27 mg/dL — ABNORMAL HIGH (ref 8–23)
CO2: 20 mmol/L — ABNORMAL LOW (ref 22–32)
Calcium: 8.8 mg/dL — ABNORMAL LOW (ref 8.9–10.3)
Chloride: 106 mmol/L (ref 98–111)
Creatinine, Ser: 1.2 mg/dL (ref 0.61–1.24)
GFR, Estimated: >60 mL/min (ref >60)
Glucose, Bld: 103 mg/dL — ABNORMAL HIGH (ref 70–99)
Potassium: 3.5 mmol/L (ref 3.5–5.1)
Sodium: 139 mmol/L (ref 135–145)

## 2019-12-10 LAB — HEPATIC FUNCTION PANEL
ALT: 33 U/L (ref 0–44)
AST: 29 U/L (ref 15–41)
Albumin: 4 g/dL (ref 3.5–5.0)
Alkaline Phosphatase: 40 U/L (ref 38–126)
Bilirubin, Direct: <0.1 mg/dL (ref 0.0–0.2)
Total Bilirubin: 0.5 mg/dL (ref 0.3–1.2)
Total Protein: 6.5 g/dL (ref 6.5–8.1)

## 2019-12-10 LAB — TROPONIN I (HIGH SENSITIVITY)
Troponin I (High Sensitivity): 2 ng/L (ref <18)
Troponin I (High Sensitivity): 5 ng/L (ref <18)

## 2019-12-10 LAB — D-DIMER, QUANTITATIVE: D-Dimer, Quant: 0.33 ug/mL-FEU (ref 0.00–0.50)

## 2019-12-10 LAB — ETHANOL: Alcohol, Ethyl (B): 76 mg/dL — ABNORMAL HIGH (ref <10)

## 2019-12-10 MED ORDER — SODIUM CHLORIDE 0.9 % IV SOLN: 1000.0000 mL | INTRAVENOUS | Status: DC

## 2019-12-10 MED ORDER — SODIUM CHLORIDE 0.9 % IV BOLUS (SEPSIS)
1000.0000 mL | Freq: Once | INTRAVENOUS | Status: AC
  Administered 2019-12-10: 1000 mL via INTRAVENOUS

## 2019-12-10 NOTE — ED Provider Notes (Signed)
[facility]EMERGENCY DEPT Provider Note  CSN: [id number] Arrival date & time: 12/09/19 2354  Chief Complaint(s) Chest Pain  HPI William Erickson is a 64 y.o. male who presents to the emergency department with syncope upon standing.  Patient did report feeling some chest discomforts with one of the episodes.  Reports that he has been having some intermittent chest discomfort over the past several weeks.  These are random in nature.  Denies any prior cardiac history.  Reports that he has had negative stress testing recently.  Does report feeling lightheaded.  Denies any recent fevers or infections.  No coughing or congestion.  No abdominal pain.  No nausea vomiting.  No diarrhea.  Did admit to drinking 2 glasses of wine tonight.  Reports that he only drinks occasionally.  Denies any illicit drug use.    Reports that he believes that he hydrates very well.  Denies being on any diuretics.  Does take Flomax for BPH.  Denies any history of DVT/PEs.  No prolonged immobilization.  No hormone replacement therapy.  He denies any blood per rectum.  HPI  Past Medical History Past Medical History:  Diagnosis Date   BPH (benign prostatic hyperplasia)    Elevated PSA    Wears glasses    Patient Active Problem List   Diagnosis Date Noted   Screen for colon cancer 12/27/2016   Hemorrhoids 12/27/2016   Anal itching 12/27/2016   Health care maintenance 12/27/2016   Alcohol abuse 12/27/2016   Nasal congestion due to prolonged use of decongestants 12/27/2016   Need for influenza vaccination 12/27/2016   Home Medication(s) Prior to Admission medications   Medication Sig Start Date End Date Taking? Authorizing Provider  aspirin EC 81 MG tablet Take 162 mg by mouth daily. Swallow whole.   Yes [provider]  calcium carbonate (OSCAL) 1500 (600 Ca) MG TABS tablet Take 1,500 mg by mouth daily with breakfast.   Yes [provider]  co-enzyme Q-10 30  MG capsule Take 30 mg by mouth daily.   Yes [provider]  KRILL OIL PO Take 1 capsule by mouth daily.   Yes [provider]  milk thistle 175 MG tablet Take 175 mg by mouth daily.   Yes [provider]  Multiple Vitamin (MULTIVITAMIN WITH MINERALS) TABS tablet Take 1 tablet by mouth daily.   Yes [provider]  TURMERIC PO Take 1 tablet by mouth daily.   Yes [provider]  pramoxine-hydrocortisone (PROCTOCREAM-HC) 1-1 % rectal cream Place 1 application rectally 2 (two) times daily. Patient not taking: Reported on 12/10/2019 12/27/16   Mliss Sax, MD  tamsulosin [facility]) 0.4 MG CAPS capsule Take 0.8 mg by mouth daily. 08/23/19   [provider]  Vitamin D, Ergocalciferol, (DRISDOL) 50000 units CAPS capsule TAKE 1 CAPSULE BY MOUTH EVERY 7 DAYS Patient not taking: Reported on 12/10/2019 10/06/17   Mliss Sax, MD  Past Surgical History Past Surgical History:  Procedure Laterality Date   NO PAST SURGERIES     PROSTATE BIOPSY N/A 01/03/2016   Procedure: BIOPSY TRANSRECTAL ULTRASONIC PROSTATE (TUBP);  Surgeon: Barron Alvine, MD;  Location: [facility];  Service: Urology;  Laterality: N/A;   Family History History reviewed. No pertinent family history.  Social History Social History   Tobacco Use   Smoking status: Former Smoker    Years: 20.00    Types: Cigarettes    Quit date: 12/28/2005    Years since quitting: 13.9   Smokeless tobacco: Never Used  Substance Use Topics   Alcohol use: Yes    Comment: occasional   Drug use: No   Allergies Ivp dye [iodinated diagnostic agents] and Shrimp [shellfish allergy]  Review of Systems Review of Systems All other systems are reviewed and are negative for acute change except as noted in the HPI  Physical Exam Vital  Signs  I have reviewed the triage vital signs BP (!) 148/96 (BP Location: Left Arm)    Pulse 78    Temp 98.7 F (37.1 C) (Oral)    Resp (!) 22    Ht 5\' 11"  (1.803 m)    Wt 90.7 kg    SpO2 99%    BMI 27.89 kg/m   Physical Exam Vitals reviewed.  Constitutional:      General: He is not in acute distress.    Appearance: He is well-developed. He is not diaphoretic.  HENT:     Head: Normocephalic and atraumatic.     Nose: Nose normal.  Eyes:     General: No scleral icterus.       Right eye: No discharge.        Left eye: No discharge.     Conjunctiva/sclera: Conjunctivae normal.     Pupils: Pupils are equal, round, and reactive to light.  Cardiovascular:     Rate and Rhythm: Normal rate and regular rhythm.     Heart sounds: No murmur heard.  No friction rub. No gallop.   Pulmonary:     Effort: Pulmonary effort is normal. No respiratory distress.     Breath sounds: Normal breath sounds. No stridor. No rales.  Abdominal:     General: There is no distension.     Palpations: Abdomen is soft.     Tenderness: There is no abdominal tenderness.  Musculoskeletal:        General: No tenderness.     Cervical back: Normal range of motion and neck supple.  Skin:    General: Skin is warm and dry.     Findings: No erythema or rash.  Neurological:     Mental Status: He is alert and oriented to person, place, and time.     ED Results and Treatments Labs (all labs ordered are listed, but only abnormal results are displayed) Labs Reviewed  BASIC METABOLIC PANEL - Abnormal; Notable for the following components:      Result Value   CO2 20 (*)    Glucose, Bld 103 (*)    BUN 27 (*)    Calcium 8.8 (*)    All other components within normal limits  ETHANOL - Abnormal; Notable for the following components:   Alcohol, Ethyl (B) 76 (*)    All other components within normal limits  CBC  D-DIMER, QUANTITATIVE (NOT AT [facility])  HEPATIC FUNCTION PANEL  TROPONIN I (HIGH SENSITIVITY)  TROPONIN I  (HIGH SENSITIVITY)  EKG  EKG Interpretation  Date/Time:  Friday December 10 2019 00:10:01 EST Ventricular Rate:  70 PR Interval:    QRS Duration: 119 QT Interval:  370 QTC Calculation: 400 R Axis:   -13 Text Interpretation: Sinus rhythm NO STEMI. No old tracing to compare Confirmed by Drema Pryardama, Ion Gonnella [phone]([id number]) on 12/10/2019 2:20:24 AM      Radiology DG Chest 2 View  Result Date: 12/10/2019 CLINICAL DATA:  Chest pain and weakness EXAM: CHEST - 2 VIEW COMPARISON:  None. FINDINGS: The heart size and mediastinal contours are within normal limits. Both lungs are clear. The visualized skeletal structures are unremarkable. IMPRESSION: No active cardiopulmonary disease. Electronically Signed   By: Deatra RobinsonKevin  Herman M.D.   On: 12/10/2019 01:02    Pertinent labs & imaging results that were available during my care of the patient were reviewed by me and considered in my medical decision making (see chart for details).  Medications Ordered in ED Medications  sodium chloride 0.9 % bolus 1,000 mL (0 mLs Intravenous Stopped 12/10/19 0415)    Followed by  0.9 %  sodium chloride infusion (1,000 mLs Intravenous Refused 12/10/19 0415)                                                                                                                                    Procedures .1-3 Lead EKG Interpretation Performed by: Nira Connardama, Casimiro Lienhard Eduardo, MD Authorized by: Nira Connardama, Glenora Morocho Eduardo, MD     Interpretation: normal     ECG rate:  78   ECG rate assessment: normal     Rhythm: sinus rhythm     Ectopy: none     Conduction: normal   .Critical Care Performed by: Nira Connardama, Zarius Furr Eduardo, MD Authorized by: Nira Connardama, Marcena Dias Eduardo, MD   Critical care provider statement:    Critical care time (minutes):  60   Critical care was necessary to treat or prevent imminent or life-threatening deterioration  of the following conditions:  Circulatory failure   Critical care was time spent personally by me on the following activities:  Discussions with consultants, evaluation of patient's response to treatment, examination of patient, ordering and performing treatments and interventions, ordering and review of laboratory studies, ordering and review of radiographic studies, pulse oximetry, re-evaluation of patient's condition, obtaining history from patient or surrogate and review of old charts    (including critical care time)  Medical Decision Making / ED Course I have reviewed the nursing notes for this encounter and the patient's prior records (if available in EHR or on provided paperwork).   William Erickson was evaluated in Emergency Department on 12/10/2019 for the symptoms described in the history of present illness. He was evaluated in the context of the global COVID-19 pandemic, which necessitated consideration that the patient might be at risk for infection with the SARS-CoV-2 virus that causes COVID-19. Institutional protocols and algorithms that pertain to the evaluation of patients at risk for  COVID-19 are in a state of rapid change based on information released by regulatory bodies including the CDC and federal and state organizations. These policies and algorithms were followed during the patient's care in the ED.  Patient presents with syncope. Had a syncopal episode here well changing positions during triage. EKG without acute ischemic changes, dysrhythmias, blocks. Patient is hypotensive with systolics in the 80s. Improves with supine position. Currently chest pain-free.  Orthostatics positive. Patient given IV fluids.  Work-up was grossly reassuring.  Negative troponin x2.  Doubt ACS or other serious cardiac event. Dimer negative.  Low suspicion for pulmonary embolism. CBC without leukocytosis or anemia. No significant electrolyte derangements. Patient did have a slight bump in  his creatinine Any urine appears to be mild burn, likely related from dehydration. Patient's alcohol is slightly elevated.  Patient symptoms and blood pressure improved with IV fluids Repeat orthostatics normal.       Final Clinical Impression(s) / ED Diagnoses Final diagnoses:  Syncope due to orthostatic hypotension   The patient appears reasonably screened and/or stabilized for discharge and I doubt any other medical condition or other [facility] requiring further screening, evaluation, or treatment in the ED at this time prior to discharge. Safe for discharge with strict return precautions.  Disposition: Discharge  Condition: Good  I have discussed the results, Dx and Tx plan with the patient/family who expressed understanding and agree(s) with the plan. Discharge instructions discussed at length. The patient/family was given strict return precautions who verbalized understanding of the instructions. No further questions at time of discharge.    ED Discharge Orders    None        Follow Up: Mliss Sax, MD [address] [place] [address] [phone]  Call  To schedule an appointment for close follow up      This chart was dictated using voice recognition software.  Despite best efforts to proofread,  errors can occur which can change the documentation meaning.   Nira Conn, MD 12/10/19 [phone]

## 2019-12-10 NOTE — ED Triage Notes (Signed)
Pt reports chest pain that started about an hour and half ago. Pt also reports LOC x2 before arrival and states he hit his head the first time. Denies blood thinners. Pt synopsized in triage and became lethargic and diaphoretic.

## 2019-12-14 ENCOUNTER — Other Ambulatory Visit: Payer: Self-pay

## 2019-12-14 ENCOUNTER — Ambulatory Visit (INDEPENDENT_AMBULATORY_CARE_PROVIDER_SITE_OTHER): Payer: 59 | Admitting: Family

## 2019-12-14 ENCOUNTER — Encounter: Payer: Self-pay | Admitting: Family

## 2019-12-14 VITALS — BP 142/84 | HR 69 | Temp 97.7°F | Ht 71.0 in | Wt 201.2 lb

## 2019-12-14 DIAGNOSIS — F419 Anxiety disorder, unspecified: Secondary | ICD-10-CM | POA: Diagnosis not present

## 2019-12-14 DIAGNOSIS — F430 Acute stress reaction: Secondary | ICD-10-CM | POA: Diagnosis not present

## 2019-12-14 DIAGNOSIS — F1010 Alcohol abuse, uncomplicated: Secondary | ICD-10-CM | POA: Diagnosis not present

## 2019-12-14 DIAGNOSIS — R55 Syncope and collapse: Secondary | ICD-10-CM

## 2019-12-14 MED ORDER — DISULFIRAM 250 MG PO TABS: 250.0000 mg | ORAL_TABLET | Freq: Every day | ORAL | 0 refills | Status: AC

## 2019-12-14 MED ORDER — LORAZEPAM 0.5 MG PO TABS: 0.5000 mg | ORAL_TABLET | Freq: Every day | ORAL | 1 refills | Status: DC | PRN

## 2019-12-14 NOTE — Patient Instructions (Signed)
Managing Stress, Adult Feeling a certain amount of stress is normal. Stress helps our body and mind get ready to deal with the demands of life. Stress hormones can motivate you to do well at work and meet your responsibilities. However severe or long-lasting (chronic) stress can affect your mental and physical health. Chronic stress puts you at higher risk for anxiety, depression, and other health problems like digestive problems, muscle aches, heart disease, high blood pressure, and stroke. What are the causes? Common causes of stress include:  Demands from work, such as deadlines, feeling overworked, or having long hours.  Pressures at home, such as money issues, disagreements with a spouse, or parenting issues.  Pressures from major life changes, such as divorce, moving, loss of a loved one, or chronic illness. You may be at higher risk for stress-related problems if you do not get enough sleep, are in poor health, do not have emotional support, or have a mental health disorder like anxiety or depression. How to recognize stress Stress can make you:  Have trouble sleeping.  Feel sad, anxious, irritable, or overwhelmed.  Lose your appetite.  Overeat or want to eat unhealthy foods.  Want to use drugs or alcohol. Stress can also cause physical symptoms, such as:  Sore, tense muscles, especially in the shoulders and neck.  Headaches.  Trouble breathing.  A faster heart rate.  Stomach pain, nausea, or vomiting.  Diarrhea or constipation.  Trouble concentrating. Follow these instructions at home: Lifestyle  Identify the source of your stress and your reaction to it. See a therapist who can help you change your reactions.  When there are stressful events: ? Talk about it with family, friends, or co-workers. ? Try to think realistically about stressful events and not ignore them or overreact. ? Try to find the positives in a stressful situation and not focus on the  negatives. ? Cut back on responsibilities at work and home, if possible. Ask for help from friends or family members if you need it.  Find ways to cope with stress, such as: ? Meditation. ? Deep breathing. ? Yoga or tai chi. ? Progressive muscle relaxation. ? Doing art, playing music, or reading. ? Making time for fun activities. ? Spending time with family and friends.  Get support from family, friends, or spiritual resources. Eating and drinking  Eat a healthy diet. This includes: ? Eating foods that are high in fiber, such as beans, whole grains, and fresh fruits and vegetables. ? Limiting foods that are high in fat and processed sugars, such as fried and sweet foods.  Do not skip meals or overeat.  Drink enough fluid to keep your urine pale yellow. Alcohol use  Do not drink alcohol if: ? Your health care provider tells you not to drink. ? You are pregnant, may be pregnant, or are planning to become pregnant.  Drinking alcohol is a way some people try to ease their stress. This can be dangerous, so if you drink alcohol: ? Limit how much you use to:  0-1 drink a day for women.  0-2 drinks a day for men. ? Be aware of how much alcohol is in your drink. In the [place] one drink equals one 12 oz bottle of beer (355 mL), one 5 oz glass of wine (148 mL), or one 1 oz glass of hard liquor (44 mL). Activity   Include 30 minutes of exercise in your daily schedule. Exercise is a good stress reducer.  Include time in your day   for an activity that you find relaxing. Try taking a walk, going on a bike ride, reading a book, or listening to music.  Schedule your time in a way that lowers stress, and keep a consistent schedule. Prioritize what is most important to get done. General instructions  Get enough sleep. Try to go to sleep and get up at about the same time every day.  Take over-the-counter and prescription medicines only as told by your health care provider.  Do not use any  products that contain nicotine or tobacco, such as cigarettes, e-cigarettes, and chewing tobacco. If you need help quitting, ask your health care provider.  Do not use drugs or smoke to cope with stress.  Keep all follow-up visits as told by your health care provider. This is important. Where to find support  Talk with your health care provider about stress management or finding a support group.  Find a therapist to work with you on your stress management techniques. Contact a health care provider if:  Your stress symptoms get worse.  You are unable to manage your stress at home.  You are struggling to stop using drugs or alcohol. Get help right away if:  You may be a danger to yourself or others.  You have any thoughts of death or suicide. If you ever feel like you may hurt yourself or others, or have thoughts about taking your own life, get help right away. You can go to your nearest emergency department or call:  Your local emergency services (911 in the [place]  A suicide crisis helpline, such as the National Suicide Prevention Lifeline at 1-800-273-8255. This is open 24 hours a day. Summary  Feeling a certain amount of stress is normal, but severe or long-lasting (chronic) stress can affect your mental and physical health.  Chronic stress can put you at higher risk for anxiety, depression, and other health problems like digestive problems, muscle aches, heart disease, high blood pressure, and stroke.  You may be at higher risk for stress-related problems if you do not get enough sleep, are in poor health, lack emotional support, or have a mental health disorder like anxiety or depression.  Identify the source of your stress and your reaction to it. Try talking about stressful events with family, friends, or co-workers, finding a coping method, or getting support from spiritual resources.  If you need more help, talk with your health care provider about finding a support group  or a mental health therapist. This information is not intended to replace advice given to you by your health care provider. Make sure you discuss any questions you have with your health care provider. Document Revised: 07/22/2018 Document Reviewed: 07/22/2018 Elsevier Patient Education  2020 Elsevier Inc.  

## 2019-12-15 ENCOUNTER — Telehealth: Payer: Self-pay

## 2019-12-15 NOTE — Progress Notes (Signed)
Acute Office Visit  Subjective:    Patient ID: William Erickson, male    DOB: 08/18/1955, 64 y.o.   MRN: [id number]  Chief Complaint  Patient presents with  . Follow-up    hospital follow up on syncope.     HPI Patient is in today for a hospital follow-up for syncope. Patient reports the first episode of syncope occurred about 4 months ago. He ha since has 3 episodes within the last 1 week. One episode was witnessed in the ED. He had another episode at home that he believes lasted hours. Patient reports having chest pressure but no palpitations or pain. Admits to chest tightness associated with the stress of work. 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 He also has a daughter who is in the hospital at [facility] with a brain tumor. She is on a ventilator and he has been worried about her. Patient also admits to binge drinking about 2-3 times a month when he visits his brother's house. He does not have a desire for alcohol unless he is in social situations. Liquor makes him violent and it is effecting his relationship with his girlfriend. He is requested a RX for antabuse to help when he is in social settings. He is also requesting a RX for ativan to use PRN when he feels anxious in court.Would like a referral to cardiology and Psychology  Past Medical History:  Diagnosis Date  . BPH (benign prostatic hyperplasia)   . Elevated PSA   . Wears glasses     Past Surgical History:  Procedure Laterality Date  . NO PAST SURGERIES    . PROSTATE BIOPSY N/A 01/03/2016   Procedure: BIOPSY TRANSRECTAL ULTRASONIC PROSTATE (TUBP);  Surgeon: Barron Alvine, MD;  Location: [facility];  Service: Urology;  Laterality: N/A;    No family history on file.  Social History   Socioeconomic History  . Marital status: Single    Spouse name: Not on file  . Number of children: Not on file  . Years of education: Not on file   . Highest education level: Not on file  Occupational History  . Not on file  Tobacco Use  . Smoking status: Former Smoker    Years: 20.00    Types: Cigarettes    Quit date: 12/28/2005    Years since quitting: 13.9  . Smokeless tobacco: Never Used  Substance and Sexual Activity  . Alcohol use: Yes    Comment: occasional  . Drug use: No  . Sexual activity: Not on file  Other Topics Concern  . Not on file  Social History Narrative  . Not on file   Social Determinants of Health   Financial Resource Strain:   . Difficulty of Paying Living Expenses: Not on file  Food Insecurity:   . Worried About Programme researcher, broadcasting/film/video in the Last Year: Not on file  . Ran Out of Food in the Last Year: Not on file  Transportation Needs:   . Lack of Transportation (Medical): Not on file  . Lack of Transportation (Non-Medical): Not on file  Physical Activity:   . Days of Exercise per Week: Not on file  . Minutes of Exercise per Session: Not on file  Stress:   . Feeling of Stress : Not on file  Social Connections:   . Frequency of Communication with Friends and Family: Not on file  . Frequency of Social  Gatherings with Friends and Family: Not on file  . Attends Religious Services: Not on file  . Active Member of Clubs or Organizations: Not on file  . Attends Banker Meetings: Not on file  . Marital Status: Not on file  Intimate Partner Violence:   . Fear of Current or Ex-Partner: Not on file  . Emotionally Abused: Not on file  . Physically Abused: Not on file  . Sexually Abused: Not on file    Outpatient Medications Prior to Visit  Medication Sig Dispense Refill  . aspirin EC 81 MG tablet Take 162 mg by mouth daily. Swallow whole.    . calcium carbonate (OSCAL) 1500 (600 Ca) MG TABS tablet Take 1,500 mg by mouth daily with breakfast.    . co-enzyme Q-10 30 MG capsule Take 30 mg by mouth daily.    Marland Kitchen KRILL OIL PO Take 1 capsule by mouth daily.    . milk thistle 175 MG tablet  Take 175 mg by mouth daily.    . Multiple Vitamin (MULTIVITAMIN WITH MINERALS) TABS tablet Take 1 tablet by mouth daily.    . tamsulosin (FLOMAX) 0.4 MG CAPS capsule Take 0.8 mg by mouth daily.    . TURMERIC PO Take 1 tablet by mouth daily.    . pramoxine-hydrocortisone (PROCTOCREAM-HC) 1-1 % rectal cream Place 1 application rectally 2 (two) times daily. (Patient not taking: Reported on 12/10/2019) 30 g 0  . Vitamin D, Ergocalciferol, (DRISDOL) 50000 units CAPS capsule TAKE 1 CAPSULE BY MOUTH EVERY 7 DAYS (Patient not taking: Reported on 12/10/2019) 12 capsule 0   No facility-administered medications prior to visit.    Allergies  Allergen Reactions  . Ivp Dye [Iodinated Diagnostic Agents] Swelling  . Shrimp [Shellfish Allergy] Swelling    Review of Systems  Constitutional: Negative.   Eyes: Negative.   Respiratory: Negative.   Cardiovascular: Negative for chest pain, palpitations and leg swelling.       Chest pressure  Gastrointestinal: Negative.   Endocrine: Negative.   Genitourinary: Negative.   Musculoskeletal: Negative.   Allergic/Immunologic: Negative.   Neurological: Positive for syncope. Negative for numbness and headaches.  Psychiatric/Behavioral: Positive for agitation. The patient is nervous/anxious.        Objective:    Physical Exam Vitals and nursing note reviewed.  Constitutional:      Appearance: Normal appearance.  HENT:     Head: Normocephalic.  Cardiovascular:     Rate and Rhythm: Normal rate and regular rhythm.     Pulses: Normal pulses.     Heart sounds: Normal heart sounds.  Pulmonary:     Effort: Pulmonary effort is normal.     Breath sounds: Normal breath sounds.  Abdominal:     General: Abdomen is flat. Bowel sounds are normal.  Musculoskeletal:        General: Normal range of motion.     Cervical back: Normal range of motion and neck supple.  Skin:    General: Skin is warm and dry.  Neurological:     General: No focal deficit present.      Mental Status: He is alert and oriented to person, place, and time.     Cranial Nerves: No cranial nerve deficit.     Sensory: No sensory deficit.     Gait: Gait normal.  Psychiatric:        Mood and Affect: Mood is anxious.        Speech: Speech is rapid and pressured.  Behavior: Behavior normal.        Cognition and Memory: Cognition normal.     BP (!) 142/84   Pulse 69   Temp 97.7 F (36.5 C) (Tympanic)   Ht 5\' 11"  (1.803 m)   Wt 201 lb 3.2 oz (91.3 kg)   SpO2 97%   BMI 28.06 kg/m  Wt Readings from Last 3 Encounters:  12/14/19 201 lb 3.2 oz (91.3 kg)  12/10/19 200 lb (90.7 kg)  12/27/16 228 lb (103.4 kg)    Health Maintenance Due  Topic Date Due  . COVID-19 Vaccine (1) Never done  . TETANUS/TDAP  Never done  . COLONOSCOPY  Never done  . INFLUENZA VACCINE  08/08/2019    There are no preventive care reminders to display for this patient.   Lab Results  Component Value Date   TSH 1.75 01/13/2017   Lab Results  Component Value Date   WBC 7.8 12/10/2019   HGB 13.8 12/10/2019   HCT 40.1 12/10/2019   MCV 90.7 12/10/2019   PLT 198 12/10/2019   Lab Results  Component Value Date   NA 139 12/10/2019   K 3.5 12/10/2019   CO2 20 (L) 12/10/2019   GLUCOSE 103 (H) 12/10/2019   BUN 27 (H) 12/10/2019   CREATININE 1.20 12/10/2019   BILITOT 0.5 12/10/2019   ALKPHOS 40 12/10/2019   AST 29 12/10/2019   ALT 33 12/10/2019   PROT 6.5 12/10/2019   ALBUMIN 4.0 12/10/2019   CALCIUM 8.8 (L) 12/10/2019   ANIONGAP 13 12/10/2019   GFR 98.57 01/13/2017   Lab Results  Component Value Date   CHOL 309 (H) 01/13/2017   Lab Results  Component Value Date   HDL 42.30 01/13/2017   No results found for: [facility] Lab Results  Component Value Date   TRIG (H) 01/13/2017    789.0 Triglyceride is over 400; calculations on Lipids are invalid.   Lab Results  Component Value Date   CHOLHDL 7 01/13/2017   No results found for: HGBA1C     Assessment & Plan:   Problem  List Items Addressed This Visit    Alcohol abuse   Relevant Orders   Ambulatory referral to Psychology    Other Visit Diagnoses    Syncope and collapse    -  Primary   Relevant Orders   Ambulatory referral to Cardiology   Anxiety       Relevant Medications   LORazepam (ATIVAN) 0.5 MG tablet   Other Relevant Orders   Ambulatory referral to Psychology   Stress reaction       Relevant Medications   LORazepam (ATIVAN) 0.5 MG tablet   Other Relevant Orders   Ambulatory referral to Psychology       Meds ordered this encounter  Medications  . disulfiram (ANTABUSE) 250 MG tablet    Sig: Take 1 tablet (250 mg total) by mouth daily.    Dispense:  30 tablet    Refill:  0  . LORazepam (ATIVAN) 0.5 MG tablet    Sig: Take 1 tablet (0.5 mg total) by mouth daily as needed for anxiety.    Dispense:  30 tablet    Refill:  1    Patient to return for an office visit in 3 months. See Cardiology and Psych for mental health and alcohol use.  03/13/2017, FNP

## 2019-12-15 NOTE — Telephone Encounter (Signed)
Patient called to ask if Worthy Rancher could call him pertaining to an elevation that she gave him during his visit.  The best time to call is Thursday afternoon.  Pt would not tell me what he wanted to discuss.  Please advise.  CB# [phone]

## 2019-12-16 ENCOUNTER — Encounter: Payer: Self-pay | Admitting: Family Medicine

## 2019-12-16 NOTE — Telephone Encounter (Signed)
Pt called back and said that he has a flight to [place] on Thursday the 16th that he already had booked before hand but with everything going on with him right now he wanted to know if you think he should cancel, He said he can get a refund if he has something stating that medically he shouldn't fly. Please advise

## 2019-12-17 NOTE — Telephone Encounter (Signed)
Left a detailed message for pt on VM.

## 2019-12-24 ENCOUNTER — Ambulatory Visit: Payer: 59 | Admitting: Internal Medicine

## 2019-12-24 NOTE — Progress Notes (Deleted)
Cardiology Office Note:    Date:  12/24/2019   ID:  William Erickson, DOB [birth year], MRN [id number]  PCP:  Mliss Sax, MD  [facility] Cardiologist:  No primary care provider on file.  [facility] Electrophysiologist:  None   CC: *** Consulted for the evaluation of syncope at the behest of Mliss Sax, MD  History of Present Illness:    William Erickson is a 64 y.o. male with a hx of alcohol abuse who presents for evaluation.  Patient notes that (s)he is feeling ***.  Has had no chest pain, chest pressure, chest tightness, chest stinging ***.  Discomfort occurs with ***, worsens with ***, and improves with ***.  Patient exertion notable for *** with *** and feels no symptoms.  No shortness of breath, DOE ***.  No PND or orthopnea***.  No bendopnea***, weight gain***, leg swelling ***, or abdominal swelling***.  No syncope or near syncope ***.  Notes *** no palpitations or funny heart beats.     Patient reports prior cardiac testing including *** echo, *** stress test, *** heart catheterizations, *** cardioversion, *** ablations.  No history of ***pre-eclampsia or prematurity.  No Fen-Fen or drug use***.  Ambulatory BP ***.   Past Medical History:  Diagnosis Date  . BPH (benign prostatic hyperplasia)   . Elevated PSA   . Wears glasses     Past Surgical History:  Procedure Laterality Date  . NO PAST SURGERIES    . PROSTATE BIOPSY N/A 01/03/2016   Procedure: BIOPSY TRANSRECTAL ULTRASONIC PROSTATE (TUBP);  Surgeon: Barron Alvine, MD;  Location: [facility];  Service: Urology;  Laterality: N/A;    Current Medications: No outpatient medications have been marked as taking for the 12/24/19 encounter (Appointment) with Christell Constant, MD.     Allergies:   Ivp dye [iodinated diagnostic agents] and Shrimp [shellfish allergy]   Social History   Socioeconomic History  . Marital status: Single    Spouse name: Not on file  . Number  of children: Not on file  . Years of education: Not on file  . Highest education level: Not on file  Occupational History  . Not on file  Tobacco Use  . Smoking status: Former Smoker    Years: 20.00    Types: Cigarettes    Quit date: 12/28/2005    Years since quitting: 13.9  . Smokeless tobacco: Never Used  Substance and Sexual Activity  . Alcohol use: Yes    Comment: occasional  . Drug use: No  . Sexual activity: Not on file  Other Topics Concern  . Not on file  Social History Narrative  . Not on file   Social Determinants of Health   Financial Resource Strain: Not on file  Food Insecurity: Not on file  Transportation Needs: Not on file  Physical Activity: Not on file  Stress: Not on file  Social Connections: Not on file     Family History: The patient's ***family history is not on file.  ROS:   Please see the history of present illness.    *** All other systems reviewed and are negative.  EKGs/Labs/Other Studies Reviewed:    The following studies were reviewed today: ***  EKG:  EKG is *** ordered today.  The ekg ordered today demonstrates ***  Recent Labs: 12/10/2019: ALT 33; BUN 27; Creatinine, Ser 1.20; Hemoglobin 13.8; Platelets 198; Potassium 3.5; Sodium 139  Recent Lipid Panel    Component Value Date/Time   CHOL  309 (H) 01/13/2017 1403   TRIG (H) 01/13/2017 1403    789.0 Triglyceride is over 400; calculations on Lipids are invalid.   HDL 42.30 01/13/2017 1403   CHOLHDL 7 01/13/2017 1403   LDLDIRECT 150.0 01/13/2017 1403     Risk Assessment/Calculations:   {Does this patient have ATRIAL FIBRILLATION?:[id number]}   Physical Exam:    VS:  There were no vitals taken for this visit.    Wt Readings from Last 3 Encounters:  12/14/19 201 lb 3.2 oz (91.3 kg)  12/10/19 200 lb (90.7 kg)  12/27/16 228 lb (103.4 kg)     GEN: *** Well nourished, well developed in no acute distress HEENT: Normal NECK: No JVD; No carotid bruits LYMPHATICS: No  lymphadenopathy CARDIAC: ***RRR, no murmurs, rubs, gallops RESPIRATORY:  Clear to auscultation without rales, wheezing or rhonchi  ABDOMEN: Soft, non-tender, non-distended MUSCULOSKELETAL:  No edema; No deformity  SKIN: Warm and dry NEUROLOGIC:  Alert and oriented x 3 PSYCHIATRIC:  Normal affect   ASSESSMENT:    No diagnosis found. PLAN:    In order of problems listed above:  Syncope/Near Syncope *** - with/without orthostatic hypotension***  - diuretics management  - SSRI's, TCAs  - vasodilators (alpha blocker, nitrates, CCB)  - with concomintant autonomic failure ***(pure autonomic failure, Parkinson disease, multiple system atrophy, Lewy body dementia, diabetes mellitus, amyloidosis, spinal cord injuries, autoimmune neuropathy (eg, Guillain-Barr), paraneoplastic neuropathy) - with/without reflex medication hypotension  - vasovagal syncope related to ***emotion, standing, climate ***  - situational related to cough/sneeze/micturition/defecation/carotid stimulation *** -with risk of tachyarrhythmia*** -with risk of bradyarrhythmia*** -with risk of structural heart disease  1 Severe aortic stenosis 2 Hypertrophic cardiomyopathy 3 Cardiac tamponade 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 stress test, cath, DCCV, TEE, etc)?   Press F2        :[id number]}    Medication Adjustments/Labs and Tests Ordered: Current medicines are reviewed at length with the patient today.  Concerns regarding medicines are outlined above.  No orders of the defined types were placed in this encounter.  No orders of the defined types were placed in  this encounter.   There are no Patient Instructions on file for this visit.   Signed, Christell Constant, MD  12/24/2019 1:04 PM    [facility]

## 2019-12-30 ENCOUNTER — Ambulatory Visit (INDEPENDENT_AMBULATORY_CARE_PROVIDER_SITE_OTHER): Payer: 59 | Admitting: Psychology

## 2019-12-30 DIAGNOSIS — F4322 Adjustment disorder with anxiety: Secondary | ICD-10-CM

## 2020-01-04 ENCOUNTER — Ambulatory Visit (INDEPENDENT_AMBULATORY_CARE_PROVIDER_SITE_OTHER): Payer: 59 | Admitting: Psychology

## 2020-01-04 DIAGNOSIS — F4322 Adjustment disorder with anxiety: Secondary | ICD-10-CM

## 2020-01-05 ENCOUNTER — Telehealth: Payer: Self-pay | Admitting: Family Medicine

## 2020-01-05 NOTE — Telephone Encounter (Signed)
Patient states that therapist Alma Downs [phone]) suggested that he be placed on an antidepressant. He would like to try Amitriptyline since it worked for him before. Patient states that his daughter passed away this morning and he's taking it pretty hard. Please call him at  [phone] if you have any questions. He said that he would like a call even if the prescription is not approved.    Pharmacy: [facility] on [address]

## 2020-01-05 NOTE — Telephone Encounter (Signed)
Please advise message below. Patient would like to speak with you.

## 2020-01-06 ENCOUNTER — Ambulatory Visit (INDEPENDENT_AMBULATORY_CARE_PROVIDER_SITE_OTHER): Payer: 59 | Admitting: Psychology

## 2020-01-06 ENCOUNTER — Other Ambulatory Visit: Payer: Self-pay | Admitting: Family

## 2020-01-06 DIAGNOSIS — F4322 Adjustment disorder with anxiety: Secondary | ICD-10-CM | POA: Diagnosis not present

## 2020-01-06 MED ORDER — AMITRIPTYLINE HCL 25 MG PO TABS
12.5000 mg | ORAL_TABLET | Freq: Every day | ORAL | 1 refills | Status: DC
Start: 2020-01-06 — End: 2020-02-29

## 2020-01-12 ENCOUNTER — Ambulatory Visit (INDEPENDENT_AMBULATORY_CARE_PROVIDER_SITE_OTHER): Payer: 59 | Admitting: Psychology

## 2020-01-12 DIAGNOSIS — F4322 Adjustment disorder with anxiety: Secondary | ICD-10-CM

## 2020-01-14 ENCOUNTER — Ambulatory Visit: Payer: 59 | Admitting: Internal Medicine

## 2020-01-14 NOTE — Progress Notes (Deleted)
Cardiology Office Note:    Date:  01/14/2020   ID:  LUDWIN FLAHIVE, DOB 02-11-1955, MRN [id number]  PCP:  Mliss Sax, MD  [facility] Cardiologist:  No primary care provider on file.  [facility] Electrophysiologist:  None   CC: *** Consulted for the evaluation of syncope and collapse at the behest of Mliss Sax, MD  History of Present Illness:    William Erickson is a 65 y.o. male with history of LBBB who presents for evaluation.  Patient notes that (s)he is feeling ***.    Patient had prior chest pain eval 12/10/19 with syncope upon standing.  At that time had intermittent CP, negative stress testing NOS, ED discharge was relatively benign.  Has had no chest pain, chest pressure, chest tightness, chest stinging ***.  Discomfort occurs with ***, worsens with ***, and improves with ***.  Patient exertion notable for *** with *** and feels no symptoms.  No shortness of breath, DOE ***.  No PND or orthopnea***.  No bendopnea***, weight gain***, leg swelling ***, or abdominal swelling***.  No syncope or near syncope ***.  Notes *** no palpitations or funny heart beats.     Patient reports prior cardiac testing including *** echo, *** stress test, *** heart catheterizations, *** cardioversion, *** ablations.  No history of ***pre-eclampsia or prematurity.  No Fen-Phen or drug use***.  Ambulatory BP ***.   Past Medical History:  Diagnosis Date  . BPH (benign prostatic hyperplasia)   . Elevated PSA   . Wears glasses     Past Surgical History:  Procedure Laterality Date  . NO PAST SURGERIES    . PROSTATE BIOPSY N/A 01/03/2016   Procedure: BIOPSY TRANSRECTAL ULTRASONIC PROSTATE (TUBP);  Surgeon: Barron Alvine, MD;  Location: [facility];  Service: Urology;  Laterality: N/A;    Current Medications: No outpatient medications have been marked as taking for the 01/14/20 encounter (Appointment) with Christell Constant, MD.     Allergies:    Ivp dye [iodinated diagnostic agents] and Shrimp [shellfish allergy]   Social History   Socioeconomic History  . Marital status: Single    Spouse name: Not on file  . Number of children: Not on file  . Years of education: Not on file  . Highest education level: Not on file  Occupational History  . Not on file  Tobacco Use  . Smoking status: Former Smoker    Years: 20.00    Types: Cigarettes    Quit date: 12/28/2005    Years since quitting: 14.0  . Smokeless tobacco: Never Used  Substance and Sexual Activity  . Alcohol use: Yes    Comment: occasional  . Drug use: No  . Sexual activity: Not on file  Other Topics Concern  . Not on file  Social History Narrative  . Not on file   Social Determinants of Health   Financial Resource Strain: Not on file  Food Insecurity: Not on file  Transportation Needs: Not on file  Physical Activity: Not on file  Stress: Not on file  Social Connections: Not on file     Family History: The patient's ***family history is not on file.  ROS:   Please see the history of present illness.    *** All other systems reviewed and are negative.  EKGs/Labs/Other Studies Reviewed:    The following studies were reviewed today: ***  EKG:  EKG is *** ordered today.  The ekg ordered today demonstrates ***  NM  Stress Testing : Date: 03/17/2015 Results:  Nuclear stress EF: 68%.  The left ventricular ejection fraction is hyperdynamic (>65%).  There was no ST segment deviation noted during stress.  The study is normal.  This is a low risk study.   Normal pharmacologic nuclear stress test with no prior infarct and no ischemia.    Recent Labs: 12/10/2019: ALT 33; BUN 27; Creatinine, Ser 1.20; Hemoglobin 13.8; Platelets 198; Potassium 3.5; Sodium 139  Recent Lipid Panel    Component Value Date/Time   CHOL 309 (H) 01/13/2017 1403   TRIG (H) 01/13/2017 1403    789.0 Triglyceride is over 400; calculations on Lipids are invalid.   HDL 42.30  01/13/2017 1403   CHOLHDL 7 01/13/2017 1403   LDLDIRECT 150.0 01/13/2017 1403     Risk Assessment/Calculations:   {Does this patient have ATRIAL FIBRILLATION?:[id number]}   Physical Exam:    VS:  There were no vitals taken for this visit.    Wt Readings from Last 3 Encounters:  12/14/19 201 lb 3.2 oz (91.3 kg)  12/10/19 200 lb (90.7 kg)  12/27/16 228 lb (103.4 kg)     GEN: *** Well nourished, well developed in no acute distress HEENT: Normal NECK: No JVD; No carotid bruits LYMPHATICS: No lymphadenopathy CARDIAC: ***RRR, no murmurs, rubs, gallops RESPIRATORY:  Clear to auscultation without rales, wheezing or rhonchi  ABDOMEN: Soft, non-tender, non-distended MUSCULOSKELETAL:  No edema; No deformity  SKIN: Warm and dry NEUROLOGIC:  Alert and oriented x 3 PSYCHIATRIC:  Normal affect   ASSESSMENT:    No diagnosis found. PLAN:    In order of problems listed above:  Chest Pain Syncope Known LBBB - The patient presents with cardiac/possibly cardiac/non-cardiac *** - EKG shows *** without evidence of accessory pathway, ventricular pacing, digoxin use, LBBB, or baseline ST changes. - ASCVD risk estimated at *** - Additional Blood Work:  Lipids ***  - ASA 81 mg QD, statin, and beta blocker therapies ***  - Sublingual nitroglycerin as need for chest pain. *** - Would recommend an echocardiogram to assess LVEF and exclude WMA.  - Would recommend CCTA to exclude obstructive CAD  - Would recommend exercise/pharmacological ***nuclear medicine stress test (NPO at midnight/hold beta blocker in AM); discussed risks, benefits, and alternatives of the diagnostic procedure including chest pain, arrhythmia, and death.  Patient amenable for testing. - if positive, discussed risks and benefits of cardiac catheterization have been discussed with the patient.  These include bleeding, infection, kidney damage, stroke, heart attack, death.  The patient understands these risks and is willing to  proceed if necessary     {Are you ordering a CV Procedure (e.g. stress test, cath, DCCV, TEE, etc)?   Press F2        :[id number]}    Medication Adjustments/Labs and Tests Ordered: Current medicines are reviewed at length with the patient today.  Concerns regarding medicines are outlined above.  No orders of the defined types were placed in this encounter.  No orders of the defined types were placed in this encounter.   There are no Patient Instructions on file for this visit.   Signed, Christell Constant, MD  01/14/2020 1:08 PM    [facility]

## 2020-01-20 LAB — PSA: PSA: 18.8

## 2020-01-24 ENCOUNTER — Ambulatory Visit: Payer: 59 | Admitting: Psychology

## 2020-01-26 ENCOUNTER — Encounter: Payer: Self-pay | Admitting: General Practice

## 2020-01-27 ENCOUNTER — Ambulatory Visit (INDEPENDENT_AMBULATORY_CARE_PROVIDER_SITE_OTHER): Payer: 59 | Admitting: Psychology

## 2020-01-27 DIAGNOSIS — F4322 Adjustment disorder with anxiety: Secondary | ICD-10-CM | POA: Diagnosis not present

## 2020-01-28 ENCOUNTER — Encounter: Payer: Self-pay | Admitting: Family Medicine

## 2020-02-29 ENCOUNTER — Other Ambulatory Visit: Payer: Self-pay | Admitting: Family

## 2020-03-28 ENCOUNTER — Other Ambulatory Visit: Payer: Self-pay | Admitting: Family

## 2020-03-28 NOTE — Telephone Encounter (Signed)
Please see refill request.

## 2020-05-02 ENCOUNTER — Other Ambulatory Visit: Payer: Self-pay | Admitting: Urology

## 2020-05-02 DIAGNOSIS — R972 Elevated prostate specific antigen [PSA]: Secondary | ICD-10-CM

## 2020-05-04 ENCOUNTER — Telehealth: Payer: Self-pay | Admitting: Family Medicine

## 2020-05-04 NOTE — Telephone Encounter (Signed)
FYI:Pt is wanting Dr. Doreene Burke to know he no longer needs refills on LORazepam (ATIVAN) 0.5 MG tablet [[id number]] and amitriptyline (ELAVIL) 25 MG tablet [[id number]].

## 2020-05-09 ENCOUNTER — Encounter: Payer: 59 | Admitting: Family Medicine

## 2020-05-19 ENCOUNTER — Ambulatory Visit
Admission: RE | Admit: 2020-05-19 | Discharge: 2020-05-19 | Disposition: A | Discharge status: Home / Self Care | Payer: 59 | Source: Ambulatory Visit | Attending: Urology | Admitting: Urology

## 2020-05-19 ENCOUNTER — Other Ambulatory Visit: Payer: Self-pay

## 2020-05-19 DIAGNOSIS — R972 Elevated prostate specific antigen [PSA]: Secondary | ICD-10-CM

## 2020-05-19 MED ORDER — GADOBENATE DIMEGLUMINE 529 MG/ML IV SOLN
20.0000 mL | Freq: Once | INTRAVENOUS | Status: AC | PRN
  Administered 2020-05-19: 20 mL via INTRAVENOUS

## 2020-06-21 ENCOUNTER — Other Ambulatory Visit (HOSPITAL_BASED_OUTPATIENT_CLINIC_OR_DEPARTMENT_OTHER): Payer: Self-pay

## 2020-06-21 ENCOUNTER — Other Ambulatory Visit: Payer: Self-pay

## 2020-06-21 ENCOUNTER — Ambulatory Visit: Payer: 59 | Attending: Internal Medicine

## 2020-06-21 DIAGNOSIS — Z23 Encounter for immunization: Secondary | ICD-10-CM

## 2020-06-21 NOTE — Progress Notes (Signed)
   Covid-19 Vaccination Clinic  Name:  William Erickson    MRN: [id number] DOB: 1955/04/11  06/21/2020  Mr. William Erickson was observed post Covid-19 immunization for 15 minutes without incident. He was provided with Vaccine Information Sheet and instruction to access the V-Safe system.   Mr. William Erickson was instructed to call 911 with any severe reactions post vaccine: Difficulty breathing  Swelling of face and throat  A fast heartbeat  A bad rash all over body  Dizziness and weakness   Immunizations Administered     Name Date Dose VIS Date Route   Moderna Covid-19 Booster Vaccine 06/21/2020 11:05 AM 0.25 mL 10/27/2019 Intramuscular   Manufacturer: Moderna   Lot: [id number]   NDC: 47425-956-38

## 2021-01-07 DEATH — deceased

## 2023-02-08 IMAGING — MR MR PROSTATE WO/W CM
12 series · 48 of 48 positions shown · IV contrast (multihance)
Comparison: Prostate MRI 05/15/2015

CLINICAL DATA: Elevated PSA  -  64-year-old male.  PSA 13

EXAM:
MR PROSTATE WITHOUT AND WITH CONTRAST
TECHNIQUE: Multiplanar multisequence MRI images were obtained of the pelvis
centered about the prostate. Pre and post contrast images were
obtained.
CONTRAST:  20mL MULTIHANCE GADOBENATE DIMEGLUMINE 529 MG/ML IV SOLN

[Series 3: T2 · coronal · 3.0mm · 0.56mm/px · 1 of 23 slices shown (1 of 3)]
[im 1/23]
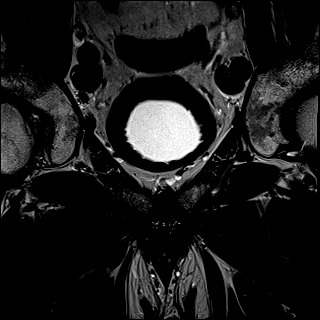

[Series 4: T1 · axial · 5.0mm · 1.25mm/px · 1 of 88 slices shown]
[im 1/88]
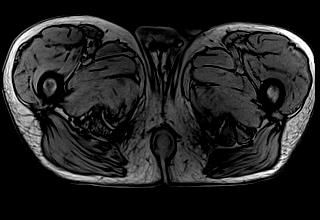

[Series 5: DWI · axial · 3.0mm · 1.75mm/px · 1 of 99 slices shown (1 of 3)]
[im 1/99]
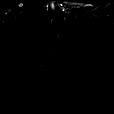

[Series 6: DWI · axial · 3.0mm · 1.75mm/px · 1 of 34 slices shown (2 of 3)]
[im 1/34]
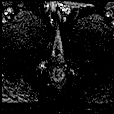

[Series 7: DWI · axial · 3.0mm · 1.75mm/px · 1 of 34 slices shown (3 of 3)]
[im 1/34]
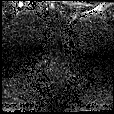

[Series 8: T2 · axial · 3.0mm · 0.56mm/px · 1 of 33 slices shown (2 of 3)]
[im 1/33]
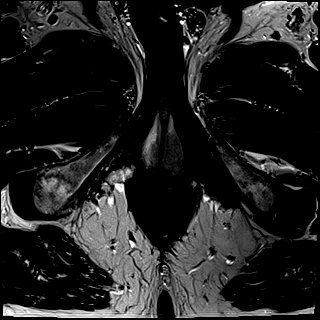

[Series 9: T2 · axial · 1.0mm · 1.04mm/px · z∈[-78,+9]mm · 2 of 88 slices shown (3 of 3)]
[im 1/88]
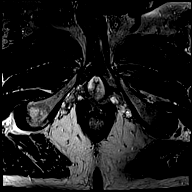
[im 88/88]
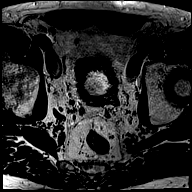

[Series 10: pre t1_twist_tra_dyn · axial · non-contrast · 3.5mm · 0.83mm/px · 1 of 26 slices shown]
[im 1/26]
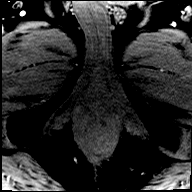

[Series 11: post t1_twist_tra_dyn-copy center · axial · non-contrast · 3.5mm · 0.83mm/px · z∈[-81,+6]mm · 18 of 780 slices shown]
[im 1/780]
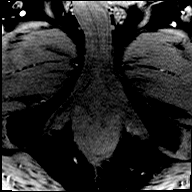
[im 46/780]
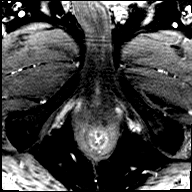
[im 92/780]
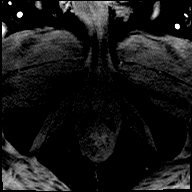
[im 138/780]
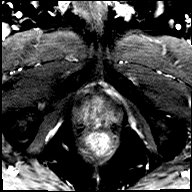
[im 184/780]
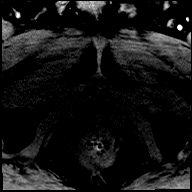
[im 230/780]
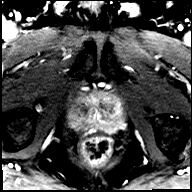
[im 275/780]
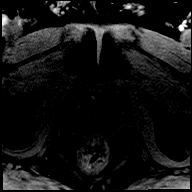
[im 321/780]
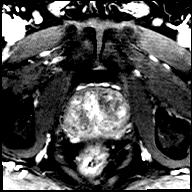
[im 367/780]
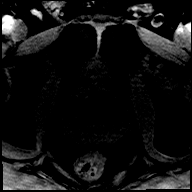
[im 413/780]
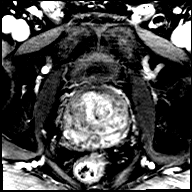
[im 459/780]
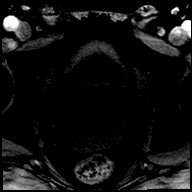
[im 505/780]
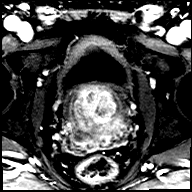
[im 550/780]
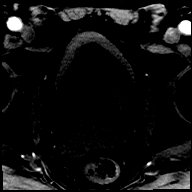
[im 596/780]
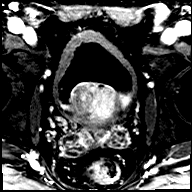
[im 642/780]
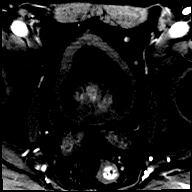
[im 688/780]
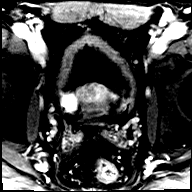
[im 734/780]
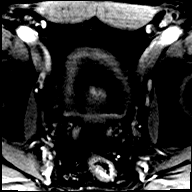
[im 780/780]
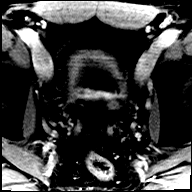

[Series 12: post t1_twist_tra_dyn-copy cent_sub · axial · 3.5mm · 0.83mm/px · z∈[-81,+6]mm · 17 of 751 slices shown]
[im 1/751]
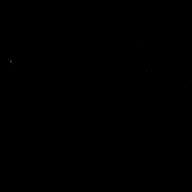
[im 47/751]
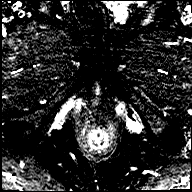
[im 94/751]
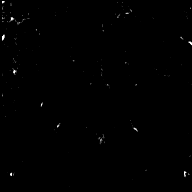
[im 141/751]
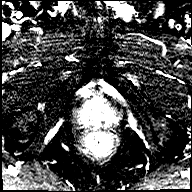
[im 188/751]
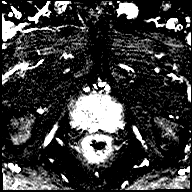
[im 235/751]
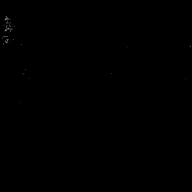
[im 282/751]
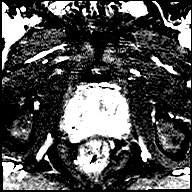
[im 329/751]
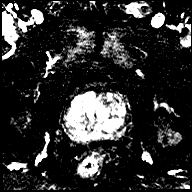
[im 376/751]
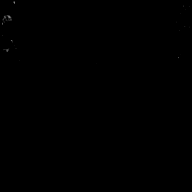
[im 422/751]
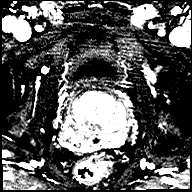
[im 469/751]
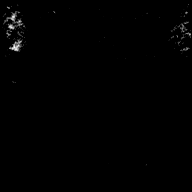
[im 516/751]
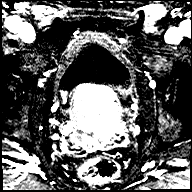
[im 563/751]
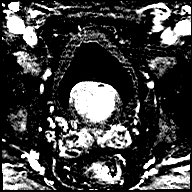
[im 610/751]
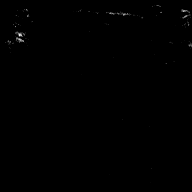
[im 657/751]
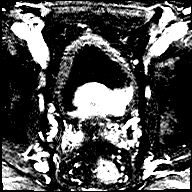
[im 704/751]
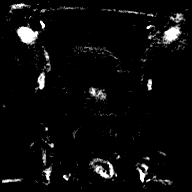
[im 751/751]
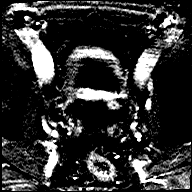

[Series 13: t1_vibe_dixon_tra_f · axial · 2.5mm · 0.91mm/px · z∈[-101,+97]mm · 2 of 80 slices shown]
[im 1/80]
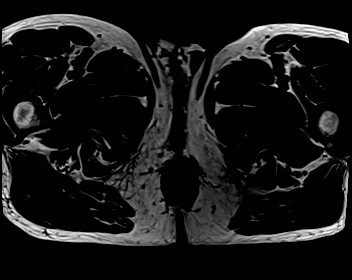
[im 80/80]
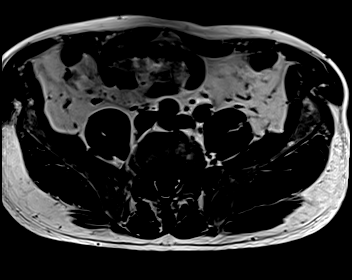

[Series 14: t1_vibe_dixon_tra_w · axial · 2.5mm · 0.91mm/px · z∈[-101,+97]mm · 2 of 80 slices shown]
[im 1/80]
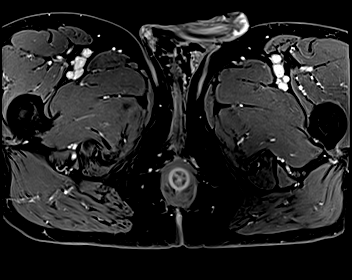
[im 80/80]
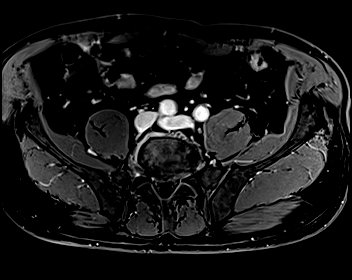

[48 of 48 positions shown; findings below may reference images not displayed]

FINDINGS: Prostate: The peripheral zone has no foci of restricted diffusion.
Mild heterogeneity of signal intensity on T2 weighted imaging
(series 8) without focality. Peripheral zone is thinned by the
enlarged transitional zone. No abnormal enhancement within the
peripheral zone.

The transitional zone is enlarged by well capsulated nodules. No
suspicious imaging characteristics on T2 weighted imaging (series 8)

Volume: 7.1 x 5.7 x 4.2 cm (volume = 89 cm^3)

Transcapsular spread:  Absent

Seminal vesicle involvement: Absent

Neurovascular bundle involvement: Absent

Pelvic adenopathy: Absent

Bone metastasis: Absent

Other findings: None
IMPRESSION: 1. No high-grade carcinoma within the peripheral zone. Mild
heterogeneous signal intensity the peripheral zone could represent
prior inflammation or prostatitis. PI-RADS: 2
2. Enlarged nodular transitional zone most consistent benign
prostate hypertrophy. PI-RADS: 2
